# Patient Record
Sex: Male | Born: 1965 | Race: Black or African American | Hispanic: No | Marital: Single | Smoking: Current every day smoker
Health system: Southern US, Community
[De-identification: ages and names within clinical notes are randomized; demographics above are authoritative.]

## PROBLEM LIST (undated history)

## (undated) DIAGNOSIS — D649 Anemia, unspecified: Secondary | ICD-10-CM

## (undated) HISTORY — DX: Anemia, unspecified: D64.9

---

## 2007-01-21 ENCOUNTER — Emergency Department (HOSPITAL_COMMUNITY): Admission: EM | Admit: 2007-01-21 | Discharge: 2007-01-21 | Payer: Self-pay | Admitting: Emergency Medicine

## 2008-09-20 ENCOUNTER — Emergency Department (HOSPITAL_COMMUNITY): Admission: EM | Admit: 2008-09-20 | Discharge: 2008-09-20 | Payer: Self-pay | Admitting: Emergency Medicine

## 2012-12-29 ENCOUNTER — Emergency Department (HOSPITAL_COMMUNITY)
Admission: EM | Admit: 2012-12-29 | Discharge: 2012-12-29 | Disposition: A | Discharge status: Home / Self Care | Payer: Self-pay | Attending: Emergency Medicine | Admitting: Emergency Medicine

## 2012-12-29 ENCOUNTER — Encounter (HOSPITAL_COMMUNITY): Payer: Self-pay

## 2012-12-29 DIAGNOSIS — Y9289 Other specified places as the place of occurrence of the external cause: Secondary | ICD-10-CM | POA: Insufficient documentation

## 2012-12-29 DIAGNOSIS — X58XXXA Exposure to other specified factors, initial encounter: Secondary | ICD-10-CM | POA: Insufficient documentation

## 2012-12-29 DIAGNOSIS — H53149 Visual discomfort, unspecified: Secondary | ICD-10-CM | POA: Insufficient documentation

## 2012-12-29 DIAGNOSIS — F17200 Nicotine dependence, unspecified, uncomplicated: Secondary | ICD-10-CM | POA: Insufficient documentation

## 2012-12-29 DIAGNOSIS — Y9389 Activity, other specified: Secondary | ICD-10-CM | POA: Insufficient documentation

## 2012-12-29 DIAGNOSIS — S058X9A Other injuries of unspecified eye and orbit, initial encounter: Secondary | ICD-10-CM | POA: Insufficient documentation

## 2012-12-29 DIAGNOSIS — S0502XA Injury of conjunctiva and corneal abrasion without foreign body, left eye, initial encounter: Secondary | ICD-10-CM

## 2012-12-29 MED ORDER — TETRACAINE HCL 0.5 % OP SOLN
1.0000 [drp] | Freq: Once | OPHTHALMIC | Status: AC
  Administered 2012-12-29: 1 [drp] via OPHTHALMIC
  Filled 2012-12-29: qty 2

## 2012-12-29 MED ORDER — HYDROCODONE-ACETAMINOPHEN 5-325 MG PO TABS: 1.0000 | ORAL_TABLET | ORAL | Status: DC | PRN

## 2012-12-29 MED ORDER — ERYTHROMYCIN 5 MG/GM OP OINT: TOPICAL_OINTMENT | OPHTHALMIC | Status: DC

## 2012-12-29 NOTE — ED Provider Notes (Signed)
CSN: [id number]     Arrival date & time 12/29/12  1922 History   This chart was scribed for non-physician practitioner,Katie Reda Citron, PA-C ,working with No att. providers found, by Karle Plumber, ED Scribe.  This patient was seen in room WTR5/WTR5 and the patient's care was started at 8:10 PM.   Chief Complaint  Patient presents with  . Eye Injury   The history is provided by the patient. No language interpreter was used.   HPI Comments:  Evan Brown is a 47 y.o. male who presents to the Emergency Department complaining of constant, moderate left eye pain since 3 PM yesterday when he was weeding. He reports foreign body sensation. He reports he has associated photophobia itching and tearing of the eye. He denies any eye discharge or vision changes.  Does not wear corrective lenses. He denies any other pertinent medical issues. Pt states his last tetanus was approximately 4 years ago.    History reviewed. No pertinent past medical history. History reviewed. No pertinent past surgical history. History reviewed. No pertinent family history. History  Substance Use Topics  . Smoking status: Current Every Day Smoker -- 0.50 packs/day    Types: Cigarettes  . Smokeless tobacco: Not on file  . Alcohol Use: Yes     Comment: social    Review of Systems  Eyes: Positive for photophobia and pain.  All other systems reviewed and are negative.    Allergies  Review of patient's allergies indicates not on file.  Home Medications  No current outpatient prescriptions on file. Triage Vitals: BP 169/97  Pulse 87  Temp(Src) 98.2 F (36.8 C) (Oral)  Resp 18  SpO2 99% Physical Exam  Nursing note and vitals reviewed. Constitutional: He is oriented to person, place, and time. He appears well-developed and well-nourished. No distress.  HENT:  Head: Normocephalic and atraumatic.  Diffuse L conjunctival injection.  Lacrimation.  EOMi w/out pain.  PERRL. Photophobia both direct and consensual.   No tenderness of globe.  Visual acuity 20/70 R and 20/100 L.  Eye stained w/ fluorescein and visualized w/ black lamp and there is an abrasion over the pupil.    Eyes:  Normal appearance  Neck: Normal range of motion.  Pulmonary/Chest: Effort normal.  Musculoskeletal: Normal range of motion.  Neurological: He is alert and oriented to person, place, and time.  Psychiatric: He has a normal mood and affect. His behavior is normal.    ED Course  Procedures (including critical care time) DIAGNOSTIC STUDIES: Oxygen Saturation is 99% on RA, normal by my interpretation.   COORDINATION OF CARE: 8:30 PM- Will administer tetracaine to relieve pain and then check for corneal abrasion. Will prescribe antibiotics and pain medications. Advised pt to get some OTC artificial tears eye drops. Will give pt a referral to an optometrist and advised him to return to the ED with worsening symptoms. Pt verbalizes understanding and agrees to plan.  Medications  tetracaine (PONTOCAINE) 0.5 % ophthalmic solution 1 drop (1 drop Left Eye Given 12/29/12 2024)    Labs Review Labs Reviewed - No data to display Imaging Review No results found.  MDM   1. Corneal abrasion, left, initial encounter   Pt presents w/ L eye irritation.  No vision changes.  Exam sig for corneal abrasion.  Pt prescribed erythromycin ointment and vicodin. Recommended artifical tears as well.  Referred to ophtho for persistent sx.  Return precautions discussed.   I personally performed the services described in this documentation, which was scribed  in my presence. The recorded information has been reviewed and is accurate.    Arie Sabina Merced Hanners, PA-C 12/29/12 [phone]

## 2012-12-29 NOTE — Discharge Instructions (Signed)
Take antibiotic as prescribed. Take vicodin as prescribed for severe pain.  Do not drive within four hours of taking this medication (may cause drowsiness or confusion).   You should also try artificial tears to flush eyes and keep them lubricated.  Folllow up with the eye doctor if your symptoms have not started to improve by Wednesday or you have a change in your vision.    Corneal Abrasion The cornea is the clear covering at the front and center of the eye. When looking at the colored portion (iris) of the eye, you are looking through that person's cornea.  This very thin tissue is made up of many layers. The surface layer is a single layer of cells called the corneal epithelium. This is one of the most sensitive tissues in the body. If a scratch or injury causes the corneal epithelium to come off, it is called a corneal abrasion. If the injury extends to the tissues below the epithelium, the condition is called a corneal ulcer.  CAUSES   Scratches.  Trauma.  Foreign body in the eye.  Some people have recurrences of abrasions in the area of the original injury even after they heal. This is called recurrent erosion syndrome. Recurrent erosion syndromes generally improve and go away with time. SYMPTOMS   Eye pain.  Difficulty or inability to keep the injured eye open.  The eye becomes very sensitive to light.  Recurrent erosions tend to happen suddenly, first thing in the morning  usually upon awakening and opening the eyes. DIAGNOSIS  Your eye professional can diagnose a corneal abrasion during an eye exam. Dye is usually placed in the eye using a drop or a small paper strip moistened by the patient's tears. When the eye is examined with a special light, the abrasion shows up clearly because of the dye. TREATMENT   Small abrasions may be treated with antibiotic drops or ointment alone.  Usually a pressure patch is specially applied. Pressure patches prevent the eye from blinking,  allowing the corneal epithelium to heal. Because blinking is less, a pressure patch also reduces the amount of pain present in the eye during healing. Most corneal abrasions heal within 2-3 days with no effect on vision. WARNING: Do not drive or operate machinery while your eye is patched. Your ability to judge distances is impaired.  If abrasion becomes infected and spreads to the deeper tissues of the cornea, a corneal ulcer can result. This is serious because it can cause corneal scarring. Corneal scars interfere with light passing through the cornea, and cause a loss of vision in the involved eye.  If your caregiver has given you a follow-up appointment, it is very important to keep that appointment. Not keeping the appointment could result in a severe eye infection or permanent loss of vision. If there is any problem keeping the appointment, you must call back to this facility for assistance. SEEK MEDICAL CARE IF:   You have pain, light sensitivity and a scratchy feeling in one eye (or both).  Your pressure patch keeps loosening up and you can blink your eye under the patch after treatment.  Any kind of discharge develops from the involved eye after treatment or if the lids stick together in the morning.  You have the same symptoms in the morning as you did with the original abrasion days, weeks or months after the abrasion healed. MAKE SURE YOU:   Understand these instructions.  Will watch your condition.  Will get help right  away if you are not doing well or get worse. Document Released: 04/01/2000 Document Revised: 06/27/2011 Document Reviewed: 11/08/2007 [facility] Patient Information 2014 [place], [place].

## 2012-12-29 NOTE — ED Notes (Signed)
Pt was doing lawn work yesterday.  Had something fly into eye.  Pt states eye continues to be painful and draining.  Did was out with eye was at home with no relief.

## 2012-12-31 NOTE — ED Provider Notes (Signed)
Medical screening examination/treatment/procedure(s) were performed by non-physician practitioner and as supervising physician I was immediately available for consultation/collaboration.   Suzi Roots, MD 12/31/12 [phone]

## 2016-01-09 ENCOUNTER — Emergency Department (HOSPITAL_COMMUNITY): Payer: Self-pay

## 2016-01-09 ENCOUNTER — Encounter (HOSPITAL_COMMUNITY): Payer: Self-pay | Admitting: Emergency Medicine

## 2016-01-09 ENCOUNTER — Emergency Department (HOSPITAL_COMMUNITY)
Admission: EM | Admit: 2016-01-09 | Discharge: 2016-01-09 | Disposition: A | Discharge status: Home / Self Care | Payer: Self-pay | Attending: Emergency Medicine | Admitting: Emergency Medicine

## 2016-01-09 DIAGNOSIS — K8020 Calculus of gallbladder without cholecystitis without obstruction: Secondary | ICD-10-CM | POA: Insufficient documentation

## 2016-01-09 DIAGNOSIS — F17210 Nicotine dependence, cigarettes, uncomplicated: Secondary | ICD-10-CM | POA: Insufficient documentation

## 2016-01-09 DIAGNOSIS — K2970 Gastritis, unspecified, without bleeding: Secondary | ICD-10-CM | POA: Insufficient documentation

## 2016-01-09 DIAGNOSIS — R112 Nausea with vomiting, unspecified: Secondary | ICD-10-CM

## 2016-01-09 DIAGNOSIS — Z791 Long term (current) use of non-steroidal anti-inflammatories (NSAID): Secondary | ICD-10-CM | POA: Insufficient documentation

## 2016-01-09 LAB — CBC
HCT: 31.6 % — ABNORMAL LOW (ref 39.0–52.0)
HEMOGLOBIN: 10.3 g/dL — AB (ref 13.0–17.0)
MCH: 30.5 pg (ref 26.0–34.0)
MCHC: 32.6 g/dL (ref 30.0–36.0)
MCV: 93.5 fL (ref 78.0–100.0)
PLATELETS: 509 10*3/uL — AB (ref 150–400)
RBC: 3.38 MIL/uL — ABNORMAL LOW (ref 4.22–5.81)
RDW: 15.5 % (ref 11.5–15.5)
WBC: 17.4 10*3/uL — ABNORMAL HIGH (ref 4.0–10.5)

## 2016-01-09 LAB — COMPREHENSIVE METABOLIC PANEL
ALBUMIN: 4.1 g/dL (ref 3.5–5.0)
ALK PHOS: 65 U/L (ref 38–126)
ALT: 10 U/L — AB (ref 17–63)
ANION GAP: 7 (ref 5–15)
AST: 14 U/L — ABNORMAL LOW (ref 15–41)
BILIRUBIN TOTAL: 0.5 mg/dL (ref 0.3–1.2)
BUN: 13 mg/dL (ref 6–20)
CALCIUM: 9.2 mg/dL (ref 8.9–10.3)
CO2: 26 mmol/L (ref 22–32)
CREATININE: 0.99 mg/dL (ref 0.61–1.24)
Chloride: 106 mmol/L (ref 101–111)
GFR calc non Af Amer: >60 mL/min (ref >60)
GLUCOSE: 99 mg/dL (ref 65–99)
Potassium: 3.7 mmol/L (ref 3.5–5.1)
Sodium: 139 mmol/L (ref 135–145)
TOTAL PROTEIN: 7.1 g/dL (ref 6.5–8.1)

## 2016-01-09 LAB — URINALYSIS, ROUTINE W REFLEX MICROSCOPIC
BILIRUBIN URINE: NEGATIVE
Glucose, UA: NEGATIVE mg/dL
HGB URINE DIPSTICK: NEGATIVE
KETONES UR: NEGATIVE mg/dL
Leukocytes, UA: NEGATIVE
NITRITE: NEGATIVE
PROTEIN: NEGATIVE mg/dL
SPECIFIC GRAVITY, URINE: 1.011 (ref 1.005–1.030)
pH: 6 (ref 5.0–8.0)

## 2016-01-09 LAB — LIPASE, BLOOD: Lipase: 31 U/L (ref 11–51)

## 2016-01-09 MED ORDER — HYDROMORPHONE HCL 1 MG/ML IJ SOLN
1.0000 mg | Freq: Once | INTRAMUSCULAR | Status: AC
  Administered 2016-01-09: 1 mg via INTRAVENOUS
  Filled 2016-01-09: qty 1

## 2016-01-09 MED ORDER — OMEPRAZOLE 20 MG PO CPDR: 20.0000 mg | DELAYED_RELEASE_CAPSULE | Freq: Every day | ORAL | 2 refills | Status: DC

## 2016-01-09 MED ORDER — MORPHINE SULFATE (PF) 4 MG/ML IV SOLN
4.0000 mg | Freq: Once | INTRAVENOUS | Status: AC
  Administered 2016-01-09: 4 mg via INTRAVENOUS
  Filled 2016-01-09: qty 1

## 2016-01-09 MED ORDER — ONDANSETRON 4 MG PO TBDP: 4.0000 mg | ORAL_TABLET | Freq: Three times a day (TID) | ORAL | 0 refills | Status: DC | PRN

## 2016-01-09 MED ORDER — IOPAMIDOL (ISOVUE-300) INJECTION 61%
100.0000 mL | Freq: Once | INTRAVENOUS | Status: AC | PRN
  Administered 2016-01-09: 100 mL via INTRAVENOUS

## 2016-01-09 MED ORDER — ONDANSETRON HCL 4 MG/2ML IJ SOLN
4.0000 mg | Freq: Once | INTRAMUSCULAR | Status: AC
  Administered 2016-01-09: 4 mg via INTRAVENOUS
  Filled 2016-01-09: qty 2

## 2016-01-09 MED ORDER — SODIUM CHLORIDE 0.9 % IV BOLUS (SEPSIS)
1000.0000 mL | Freq: Once | INTRAVENOUS | Status: AC
  Administered 2016-01-09: 1000 mL via INTRAVENOUS

## 2016-01-09 NOTE — Discharge Instructions (Signed)
Take the prescribed medication as directed. Recommend to avoid spicy/greasy/fatty foods as this will likely make you stomach pain worse. Follow-up with GI-- call to make appt. Return to the ED for new or worsening symptoms.

## 2016-01-09 NOTE — ED Provider Notes (Signed)
[facility] DEPT Provider Note   CSN: [id number] Arrival date & time: 01/09/16  1008     History   Chief Complaint Chief Complaint  Patient presents with  . Abdominal Pain    HPI Evan Brown is a 50 y.o. male.  The history is provided by the patient.  Abdominal Pain   Associated symptoms include vomiting.    50 year old male with no significant past medical history presenting to the ED for abdominal pain. He reports this is been going on for the past 3 weeks or more. He reports pain in his epigastric region. No noted alleviating or exacerbating factors. States he has been eating and drinking normally. Intermittent episodes of vomiting. No diarrhea, bowel movements have been normal. No fever or chills. Prior abdominal surgeries. States she took some Aleve last night and this morning without significant relief. Denies any chest pain or shortness of breath.  History reviewed. No pertinent past medical history.  There are no active problems to display for this patient.   History reviewed. No pertinent surgical history.     Home Medications    Prior to Admission medications   Medication Sig Start Date End Date Taking? Authorizing Provider  erythromycin ophthalmic ointment Place a 1/2 inch ribbon of ointment  into the lower eyelid every four hours 12/29/12   Gertha Calkin, PA-C  HYDROcodone-acetaminophen (NORCO/VICODIN) 5-325 MG per tablet Take 1 tablet by mouth every 4 (four) hours as needed for pain. 12/29/12   Gertha Calkin, PA-C    Family History No family history on file.  Social History Social History  Substance Use Topics  . Smoking status: Current Every Day Smoker    Packs/day: 0.50    Types: Cigarettes  . Smokeless tobacco: Not on file  . Alcohol use Yes     Comment: social     Allergies   Review of patient's allergies indicates not on file.   Review of Systems Review of Systems  Gastrointestinal: Positive for abdominal pain and  vomiting.  All other systems reviewed and are negative.    Physical Exam Updated Vital Signs BP 151/86 (BP Location: Left Arm)   Pulse 78   Temp 97.6 F (36.4 C) (Oral)   Resp 16   SpO2 100%   Physical Exam  Constitutional: He is oriented to person, place, and time. He appears well-developed and well-nourished.  HENT:  Head: Normocephalic and atraumatic.  Mouth/Throat: Oropharynx is clear and moist.  Eyes: Conjunctivae and EOM are normal. Pupils are equal, round, and reactive to light.  Neck: Normal range of motion.  Cardiovascular: Normal rate, regular rhythm and normal heart sounds.   Pulmonary/Chest: Effort normal and breath sounds normal.  Abdominal: Soft. Bowel sounds are normal. There is tenderness.  Abdomen soft, nondistended, tenderness in the epigastrium without rebound or guarding  Musculoskeletal: Normal range of motion.  Neurological: He is alert and oriented to person, place, and time.  Skin: Skin is warm and dry.  Psychiatric: He has a normal mood and affect.  Nursing note and vitals reviewed.    ED Treatments / Results  Labs (all labs ordered are listed, but only abnormal results are displayed) Labs Reviewed  COMPREHENSIVE METABOLIC PANEL - Abnormal; Notable for the following:       Result Value   AST 14 (*)    ALT 10 (*)    All other components within normal limits  CBC - Abnormal; Notable for the following:    WBC 17.4 (*)    RBC 3.38 (*)  Hemoglobin 10.3 (*)    HCT 31.6 (*)    Platelets 509 (*)    All other components within normal limits  LIPASE, BLOOD  URINALYSIS, ROUTINE W REFLEX MICROSCOPIC (NOT AT [facility])    EKG  EKG Interpretation None       Radiology US Abdomen Complete  Result Date: 01/09/2016 CLINICAL DATA:  Epigastric pain. EXAM: ABDOMEN ULTRASOUND COMPLETE COMPARISON:  None. FINDINGS: Gallbladder: There are dependent gallstones, largest measuring 1.6 cm. There is some associated sludge. There is no wall thickening or  pericholecystic fluid. Common bile duct: Diameter: 4.5 mm Liver: No focal lesion identified. Within normal limits in parenchymal echogenicity. IVC: No abnormality visualized. Pancreas: Visualized portion unremarkable. Spleen: Size and appearance within normal limits. Right Kidney: Length: 12.3 cm. Echogenicity within normal limits. No mass or hydronephrosis visualized. Left Kidney: Length: 11.8 cm. Echogenicity within normal limits. No mass or hydronephrosis visualized. Abdominal aorta: No aneurysm visualized. Other findings: None. IMPRESSION: 1. Cholelithiasis without evidence of acute cholecystitis. No bile duct dilation. 2. No other abnormalities. Electronically Signed   By: Lajean Manes M.D.   On: 01/09/2016 11:35   Ct Abdomen Pelvis W Contrast  Result Date: 01/09/2016 CLINICAL DATA:  Diffuse abdominal pain for the past 3 weeks. The pain is worsening. Vomiting. EXAM: CT ABDOMEN AND PELVIS WITH CONTRAST TECHNIQUE: Multidetector CT imaging of the abdomen and pelvis was performed using the standard protocol following bolus administration of intravenous contrast. CONTRAST:  124mL ISOVUE-300 IOPAMIDOL (ISOVUE-300) INJECTION 61% COMPARISON:  Abdomen ultrasound obtained today. FINDINGS: Lower chest: No acute abnormality. Hepatobiliary: Normal appearing liver. 7 mm linear gallstone in the gallbladder without wall thickening or pericholecystic fluid. The previously demonstrated larger gallstone is not visible by CT. Pancreas: Unremarkable. No pancreatic ductal dilatation or surrounding inflammatory changes. Spleen: Normal in size without focal abnormality. Adrenals/Urinary Tract: 2.2 x 1.6 cm left adrenal mass. Normal appearing right adrenal gland. Tiny upper pole right renal cyst and possible tiny lower pole right renal calculus. Normal appearing left kidney, ureters and urinary bladder. Stomach/Bowel: Mild to moderate medium to low density wall thickening involving the mid and distal portions of the stomach, most  pronounced in the region of the antrum. Appendix appears normal. No evidence of bowel wall thickening, distention, or inflammatory changes. Vascular/Lymphatic: Tortuous aorta. Mild iliac and distal abdominal aorta calcifications. No aneurysm. No enlarged lymph nodes. Reproductive: Normal sized prostate gland. Other: No abdominal wall hernia or abnormality. No abdominopelvic ascites. Musculoskeletal: Minimal bilateral hip degenerative changes. Mild lumbar and lower thoracic spine degenerative changes. IMPRESSION: 1. Mild-to-moderate medium to low density wall thickening involving the mid and distal portions of the stomach. This may be due to gastritis. 2. Possible tiny, nonobstructing lower pole right renal calculus. 3. Cholelithiasis. Electronically Signed   By: Claudie Revering M.D.   On: 01/09/2016 14:46    Procedures Procedures (including critical care time)  Medications Ordered in ED Medications - No data to display   Initial Impression / Assessment and Plan / ED Course  I have reviewed the triage vital signs and the nursing notes.  Pertinent labs & imaging results that were available during my care of the patient were reviewed by me and considered in my medical decision making (see chart for details).  Clinical Course   50 year old male here with ongoing epigastric pain for the past 3 weeks or so.  He reports associated nausea or vomiting. He is afebrile and nontoxic. Tenderness in the epigastrium without rebound or guarding. Labs pending. Will obtain ultrasound. Morphine  and Zofran given.  12:52 PM Patient reevaluated, no change in pain after morphine. Labs with significant leukocytosis at 17 K. LFTs normal. Lipase normal. Ultrasound with gallstones, no signs of cholecystitis.  Only small portion of pancreas visualized on ultrasound.  Patient continue with only epigastric pain. Will obtain CT scan for further evaluation  2:56 PM CT scan with findings of gastritis.  No hx of gastic ulcers.   Has never seen GI in the past.  Pain improved here.  No active emesis in the ED.  Will start on PPI, refer to GI for follow-up.  Recommended dietary modifications including limiting fatty/greasy/spicy foods to help better control gastritis symptoms. Discussed plan with patient, he acknowledged understanding and agreed with plan of care.  Return precautions given for new or worsening symptoms.  Final Clinical Impressions(s) / ED Diagnoses   Final diagnoses:  Gastritis  Gallstones  Nausea and vomiting, vomiting of unspecified type    New Prescriptions New Prescriptions   OMEPRAZOLE (PRILOSEC) 20 MG CAPSULE    Take 1 capsule (20 mg total) by mouth daily.   ONDANSETRON (ZOFRAN ODT) 4 MG DISINTEGRATING TABLET    Take 1 tablet (4 mg total) by mouth every 8 (eight) hours as needed for nausea.     Larene Pickett, PA-C 01/09/16 1501    Daleen Bo, MD 01/09/16 1630

## 2016-01-09 NOTE — ED Notes (Signed)
Patient transported to CT 

## 2016-01-09 NOTE — ED Notes (Signed)
Ultrasound at bedside

## 2016-01-09 NOTE — ED Triage Notes (Signed)
Pt c/o generalized abdominal pain x 3 weeks. Pt denies any exacerbating circumstances, sts "It's just getting progressively worse. Pt A&Ox4 and ambulatory. C/o emesis. Denies diarrhea. Denies chest pain or SOB.

## 2016-01-09 NOTE — ED Notes (Signed)
Patient returned from CT

## 2018-11-05 ENCOUNTER — Encounter (HOSPITAL_COMMUNITY): Payer: Self-pay

## 2018-11-05 ENCOUNTER — Emergency Department (HOSPITAL_COMMUNITY)
Admission: EM | Admit: 2018-11-05 | Discharge: 2018-11-05 | Disposition: A | Discharge status: Home / Self Care | Payer: Medicaid Other | Attending: Emergency Medicine | Admitting: Emergency Medicine

## 2018-11-05 ENCOUNTER — Emergency Department (HOSPITAL_COMMUNITY): Payer: Medicaid Other

## 2018-11-05 ENCOUNTER — Other Ambulatory Visit: Payer: Self-pay

## 2018-11-05 DIAGNOSIS — F17210 Nicotine dependence, cigarettes, uncomplicated: Secondary | ICD-10-CM | POA: Diagnosis not present

## 2018-11-05 DIAGNOSIS — R1013 Epigastric pain: Secondary | ICD-10-CM | POA: Insufficient documentation

## 2018-11-05 LAB — URINALYSIS, ROUTINE W REFLEX MICROSCOPIC
Bacteria, UA: NONE SEEN
Bilirubin Urine: NEGATIVE
Glucose, UA: NEGATIVE mg/dL
Hgb urine dipstick: NEGATIVE
Ketones, ur: 5 mg/dL — AB
Leukocytes,Ua: NEGATIVE
Nitrite: NEGATIVE
Protein, ur: 30 mg/dL — AB
Specific Gravity, Urine: 1.023 (ref 1.005–1.030)
pH: 5 (ref 5.0–8.0)

## 2018-11-05 LAB — CBC
HCT: 52.7 % — ABNORMAL HIGH (ref 39.0–52.0)
Hemoglobin: 16.8 g/dL (ref 13.0–17.0)
MCH: 29.7 pg (ref 26.0–34.0)
MCHC: 31.9 g/dL (ref 30.0–36.0)
MCV: 93.1 fL (ref 80.0–100.0)
Platelets: 434 10*3/uL — ABNORMAL HIGH (ref 150–400)
RBC: 5.66 MIL/uL (ref 4.22–5.81)
RDW: 13.3 % (ref 11.5–15.5)
WBC: 14.8 10*3/uL — ABNORMAL HIGH (ref 4.0–10.5)
nRBC: 0 % (ref 0.0–0.2)

## 2018-11-05 LAB — COMPREHENSIVE METABOLIC PANEL
ALT: 17 U/L (ref 0–44)
AST: 14 U/L — ABNORMAL LOW (ref 15–41)
Albumin: 4.5 g/dL (ref 3.5–5.0)
Alkaline Phosphatase: 89 U/L (ref 38–126)
Anion gap: 9 (ref 5–15)
BUN: 17 mg/dL (ref 6–20)
CO2: 28 mmol/L (ref 22–32)
Calcium: 10.4 mg/dL — ABNORMAL HIGH (ref 8.9–10.3)
Chloride: 104 mmol/L (ref 98–111)
Creatinine, Ser: 1.23 mg/dL (ref 0.61–1.24)
GFR calc Af Amer: >60 mL/min (ref >60)
GFR calc non Af Amer: >60 mL/min (ref >60)
Glucose, Bld: 137 mg/dL — ABNORMAL HIGH (ref 70–99)
Potassium: 3.8 mmol/L (ref 3.5–5.1)
Sodium: 141 mmol/L (ref 135–145)
Total Bilirubin: 0.7 mg/dL (ref 0.3–1.2)
Total Protein: 7.9 g/dL (ref 6.5–8.1)

## 2018-11-05 LAB — LIPASE, BLOOD: Lipase: 28 U/L (ref 11–51)

## 2018-11-05 MED ORDER — LIDOCAINE VISCOUS HCL 2 % MT SOLN
15.0000 mL | Freq: Once | OROMUCOSAL | Status: AC
  Administered 2018-11-05: 15 mL via ORAL
  Filled 2018-11-05: qty 15

## 2018-11-05 MED ORDER — ONDANSETRON HCL 4 MG/2ML IJ SOLN
4.0000 mg | Freq: Once | INTRAMUSCULAR | Status: AC
  Administered 2018-11-05: 16:00:00 4 mg via INTRAVENOUS
  Filled 2018-11-05: qty 2

## 2018-11-05 MED ORDER — IOHEXOL 300 MG/ML  SOLN
100.0000 mL | Freq: Once | INTRAMUSCULAR | Status: AC | PRN
  Administered 2018-11-05: 100 mL via INTRAVENOUS

## 2018-11-05 MED ORDER — ONDANSETRON 4 MG PO TBDP
4.0000 mg | ORAL_TABLET | Freq: Once | ORAL | Status: AC | PRN
  Administered 2018-11-05: 4 mg via ORAL
  Filled 2018-11-05: qty 1

## 2018-11-05 MED ORDER — ALUM & MAG HYDROXIDE-SIMETH 200-200-20 MG/5ML PO SUSP
30.0000 mL | Freq: Once | ORAL | Status: AC
  Administered 2018-11-05: 30 mL via ORAL
  Filled 2018-11-05: qty 30

## 2018-11-05 MED ORDER — SUCRALFATE 1 GM/10ML PO SUSP: 1.0000 g | Freq: Three times a day (TID) | ORAL | 0 refills | Status: DC

## 2018-11-05 MED ORDER — SODIUM CHLORIDE 0.9% FLUSH
3.0000 mL | Freq: Once | INTRAVENOUS | Status: AC
  Administered 2018-11-05: 3 mL via INTRAVENOUS

## 2018-11-05 MED ORDER — ONDANSETRON 4 MG PO TBDP: 4.0000 mg | ORAL_TABLET | Freq: Three times a day (TID) | ORAL | 0 refills | Status: DC | PRN

## 2018-11-05 MED ORDER — FAMOTIDINE 20 MG PO TABS: 20.0000 mg | ORAL_TABLET | Freq: Two times a day (BID) | ORAL | 0 refills | Status: DC

## 2018-11-05 MED ORDER — SODIUM CHLORIDE 0.9 % IV BOLUS
1000.0000 mL | Freq: Once | INTRAVENOUS | Status: AC
  Administered 2018-11-05: 1000 mL via INTRAVENOUS

## 2018-11-05 MED ORDER — SODIUM CHLORIDE (PF) 0.9 % IJ SOLN
INTRAMUSCULAR | Status: AC
  Filled 2018-11-05: qty 50

## 2018-11-05 MED ORDER — FAMOTIDINE IN NACL 20-0.9 MG/50ML-% IV SOLN
20.0000 mg | Freq: Once | INTRAVENOUS | Status: AC
  Administered 2018-11-05: 20 mg via INTRAVENOUS
  Filled 2018-11-05: qty 50

## 2018-11-05 MED ORDER — MORPHINE SULFATE (PF) 4 MG/ML IV SOLN
4.0000 mg | Freq: Once | INTRAVENOUS | Status: AC
  Administered 2018-11-05: 4 mg via INTRAVENOUS
  Filled 2018-11-05: qty 1

## 2018-11-05 MED ORDER — OMEPRAZOLE 40 MG PO CPDR: 40.0000 mg | DELAYED_RELEASE_CAPSULE | Freq: Every day | ORAL | 0 refills | Status: DC

## 2018-11-05 NOTE — ED Provider Notes (Signed)
[facility] DEPT Provider Note   CSN: [id number] Arrival date & time: 11/05/18  1037     History   Chief Complaint Chief Complaint  Patient presents with  . Abdominal Pain    HPI Evan Brown is a 53 y.o. male with a hx of HTN- no longer on anti-hypertensive medication who presents to the ED with complaints of abdominal pain x 3 days. Patient reports pain is located in the periumbilical/epgastirc area, it is sharp/burning in nature currenlty a 7/10 in severity, initially tums were helping but are not anymore, currently w/o alleviating/aggravating factors. Reports associated nausea with emesis that began last evening- has had 3-4 episodes of non bloody vomiting. Denies fever, chills, diarrhea, constipation, melena, hematochezia, hematemesis, dysuria, testicular pain/swelling, chest pain, or dyspnea. Feel similar to prior gastritis, has been taking omeprazole as prescribed w/p relief.      HPI  History reviewed. No pertinent past medical history.  There are no active problems to display for this patient.   History reviewed. No pertinent surgical history.      Home Medications    Prior to Admission medications   Medication Sig Start Date End Date Taking? Authorizing Provider  Ca Carbonate-Mag Hydroxide (ROLAIDS) 550-110 MG CHEW Chew 1-2 tablets by mouth daily as needed (heartburtn).    [provider]  Naproxen Sod-Diphenhydramine (ALEVE PM PO) Take 2 tablets by mouth at bedtime as needed (pain,sleep).    [provider]  omeprazole (PRILOSEC) 20 MG capsule Take 1 capsule (20 mg total) by mouth daily. 01/09/16   Larene Pickett, PA-C  ondansetron (ZOFRAN ODT) 4 MG disintegrating tablet Take 1 tablet (4 mg total) by mouth every 8 (eight) hours as needed for nausea. 01/09/16   Larene Pickett, PA-C    Family History No family history on file.  Social History Social History   Tobacco Use  . Smoking status: Current Every Day  Smoker    Packs/day: 0.50    Types: Cigarettes  . Smokeless tobacco: Never Used  Substance Use Topics  . Alcohol use: Not Currently    Frequency: Never  . Drug use: Never     Allergies   Patient has no known allergies.   Review of Systems Review of Systems  Constitutional: Negative for chills and fever.  Respiratory: Negative for shortness of breath.   Cardiovascular: Negative for chest pain.  Gastrointestinal: Positive for abdominal pain, nausea and vomiting. Negative for blood in stool, constipation and diarrhea.  Genitourinary: Negative for discharge, dysuria, flank pain, scrotal swelling and testicular pain.  Neurological: Negative for weakness and numbness.  All other systems reviewed and are negative.    Physical Exam Updated Vital Signs BP (!) 187/117   Pulse 89   Temp 97.8 F (36.6 C) (Oral)   Resp 18   Wt 95.3 kg   SpO2 95%   Physical Exam Vitals signs and nursing note reviewed.  Constitutional:      General: He is not in acute distress.    Appearance: He is well-developed. He is not toxic-appearing.  HENT:     Head: Normocephalic and atraumatic.  Eyes:     General:        Right eye: No discharge.        Left eye: No discharge.     Conjunctiva/sclera: Conjunctivae normal.  Neck:     Musculoskeletal: Neck supple.  Cardiovascular:     Rate and Rhythm: Normal rate and regular rhythm.     Pulses:  Dorsalis pedis pulses are 2+ on the right side and 2+ on the left side.       Posterior tibial pulses are 2+ on the right side and 2+ on the left side.  Pulmonary:     Effort: Pulmonary effort is normal. No respiratory distress.     Breath sounds: Normal breath sounds. No wheezing, rhonchi or rales.  Abdominal:     General: Bowel sounds are normal. There is no distension or abdominal bruit.     Palpations: Abdomen is soft.     Tenderness: There is abdominal tenderness in the epigastric area and periumbilical area. There is no guarding or rebound.  Negative signs include Murphy's sign and McBurney's sign.  Skin:    General: Skin is warm and dry.     Findings: No rash.  Neurological:     General: No focal deficit present.     Mental Status: He is alert.     Comments: Clear speech.   Psychiatric:        Behavior: Behavior normal.    ED Treatments / Results  Labs (all labs ordered are listed, but only abnormal results are displayed) Labs Reviewed  COMPREHENSIVE METABOLIC PANEL - Abnormal; Notable for the following components:      Result Value   Glucose, Bld 137 (*)    Calcium 10.4 (*)    AST 14 (*)    All other components within normal limits  CBC - Abnormal; Notable for the following components:   WBC 14.8 (*)    HCT 52.7 (*)    Platelets 434 (*)    All other components within normal limits  URINALYSIS, ROUTINE W REFLEX MICROSCOPIC - Abnormal; Notable for the following components:   Ketones, ur 5 (*)    Protein, ur 30 (*)    All other components within normal limits  LIPASE, BLOOD    EKG None  Radiology Ct Abdomen Pelvis W Contrast  Result Date: 11/05/2018 CLINICAL DATA:  Abdominal pain for 2 days, generalized abdominal pain, no relief with omeprazole, smoker EXAM: CT ABDOMEN AND PELVIS WITH CONTRAST TECHNIQUE: Multidetector CT imaging of the abdomen and pelvis was performed using the standard protocol following bolus administration of intravenous contrast. Sagittal and coronal MPR images reconstructed from axial data set. CONTRAST:  154mL OMNIPAQUE IOHEXOL 300 MG/ML SOLN IV. No oral contrast. COMPARISON:  01/09/2016 FINDINGS: Lower chest: Mild dependent bibasilar atelectasis Hepatobiliary: Single small calculus within gallbladder. Gallbladder and liver otherwise normal appearance. No biliary dilatation. Pancreas: Normal appearance Spleen: Normal appearance Adrenals/Urinary Tract: LEFT adrenal mass 2.7 x 1.9 cm image 29, demonstrating significant washout of contrast on delayed images consistent with adrenal adenoma. RIGHT  adrenal gland unremarkable. Kidneys, ureters, and poorly distended bladder otherwise normal appearance. Stomach/Bowel: Stomach incompletely distended, limiting assessment. Gastric wall appears thickened and irregular on the previous exam, showing only a questionable focus of wall thickening at the lesser curve. Remainder stomach distends normally. Stomach and bowel loops normal appearance. Appendix normal. Vascular/Lymphatic: Atherosclerotic calcifications of aorta and iliac arteries, which are tortuous but normal in caliber. No adenopathy. Reproductive: Unremarkable seminal vesicles and prostate gland Other: No free air or free fluid. No hernia or inflammatory process. Musculoskeletal: No acute osseous findings. IMPRESSION: Slight increase in size of a LEFT adrenal mass with washout characteristics consistent with an adrenal adenoma. Significantly improved gastric wall thickening since the previous study with only a single segment of questionable wall thickening at the lesser curve of the mid stomach, cannot exclude gastritis or mass; follow-up  upper endoscopy or upper GI assessment recommended. Cholelithiasis. Electronically Signed   By: Lavonia Dana M.D.   On: 11/05/2018 17:15    Procedures Procedures (including critical care time)  Medications Ordered in ED Medications  sodium chloride flush (NS) 0.9 % injection 3 mL (has no administration in time range)  sodium chloride 0.9 % bolus 1,000 mL (has no administration in time range)  ondansetron (ZOFRAN) injection 4 mg (has no administration in time range)  morphine 4 MG/ML injection 4 mg (has no administration in time range)  ondansetron (ZOFRAN-ODT) disintegrating tablet 4 mg (4 mg Oral Given 11/05/18 1050)     Initial Impression / Assessment and Plan / ED Course  I have reviewed the triage vital signs and the nursing notes.  Pertinent labs & imaging results that were available during my care of the patient were reviewed by me and considered in my  medical decision making (see chart for details).    Patient presents to the ED with complaints of abdominal pain. Patient nontoxic appearing, in no apparent distress, vitals WNL other than elevated BP- low suspicion for HTN emergency On exam patient tender to epigastrium & periumbilical area, no peritoneal signs. Will evaluate with labs and CT abdomen/pelvis. Analgesics, anti-emetics, and fluids administered.   ER work-up reviewed:  CBC: Leukocytosis @ 14.8. Platelets somewhat elevated @ 434. No anemia CMP: Mild hyperglycemia @ 137. No significant electrolyte derangement. Renal function preserved.  Lipase: WNL UA: Mild ketonuria & proteinuria- PCP recheck, no UTI  CT A/P: ernia or inflammatory process. Musculoskeletal: No acute osseous findings. IMPRESSION: Slight increase in size of a LEFT adrenal mass with washout characteristics consistent with an adrenal adenoma. Significantly improved gastric wall thickening since the previous study with only a single segment of questionable wall thickening at the lesser curve of the mid stomach, cannot exclude gastritis or mass; follow-up upper endoscopy or upper GI assessment recommended. Cholelithiasis.  On repeat abdominal exam patient remains without peritoneal signs, doubt cholecystitis, pancreatitis, diverticulitis, appendicitis, or bowel obstruction/perforation. BP improving, symmetric distal pulses, doubt dissection.  Overall sxs seem consistent w/ gastritis to a degree given H&P & CT findings. He is feeling much better, tolerating PO, states he feels ready to go home. Will increase PPI to omeprazole 40 mg daily from his prior 20 mg, start H2 blocker, & carafate, GI follow up. I discussed results, treatment plan, need for follow-up, and return precautions with the patient. Provided opportunity for questions, patient confirmed understanding and is in agreement with plan.   Vitals:   11/05/18 1830 11/05/18 1930  BP: (!) 153/101 123/79  Pulse: 73 72   Resp: 12 20  Temp:    SpO2: 98% 96%    Final Clinical Impressions(s) / ED Diagnoses   Final diagnoses:  Epigastric pain    ED Discharge Orders         Ordered    ondansetron (ZOFRAN ODT) 4 MG disintegrating tablet  Every 8 hours PRN     11/05/18 1936    omeprazole (PRILOSEC) 40 MG capsule  Daily     11/05/18 1936    famotidine (PEPCID) 20 MG tablet  2 times daily     11/05/18 1936    sucralfate (CARAFATE) 1 GM/10ML suspension  3 times daily with meals & bedtime     11/05/18 [address] 11/05/18 Despina Hick, MD 11/05/18 2256

## 2018-11-05 NOTE — Discharge Instructions (Addendum)
You were seen in the ER today for abdominal pain Your labs showed that you had some ketones/protein in your urine- please have this rechecked by primary care within 1-2 weeks.   Your CT scan showed an adrenal adenoma which was present on prior imaging.  CT scan also showed improvement in the appearance of your gastritis on last imaging, there is however questionable area that may be gastritis versus a mass, is extremely important that you follow-up with gastroenterology for further evaluation of this.  Please call [facility] tomorrow to schedule an appointment within the next 3 days.  Send you home with omeprazole (an increased dose from your prior dosing), Pepcid, carafate and zofran. Please take these as prescribed.   We have prescribed you new medication(s) today. Discuss the medications prescribed today with your pharmacist as they can have adverse effects and interactions with your other medicines including over the counter and prescribed medications. Seek medical evaluation if you start to experience new or abnormal symptoms after taking one of these medicines, seek care immediately if you start to experience difficulty breathing, feeling of your throat closing, facial swelling, or rash as these could be indications of a more serious allergic reaction  Please also follow attached diet guidelines.  Your blood pressure was elevated in the emergency department, please call primary care to set up a follow-up appointment for this as well as for your labs, if you do not have primary care please see the information circled in your discharge instructions.  Return to the ER for new or worsening symptoms including but not limited to fever, increased pain, inability to keep fluids down, blood in your stool, blood in vomit, or any other concerns.

## 2018-11-05 NOTE — ED Triage Notes (Signed)
Pt states he has hx of gastritis. Pt states his stomach has been bothering him x 2 days. Pt c/o generalized abd pain.  No relief with omeprazole.

## 2018-12-27 ENCOUNTER — Emergency Department (HOSPITAL_COMMUNITY): Payer: Medicaid Other

## 2018-12-27 ENCOUNTER — Encounter (HOSPITAL_COMMUNITY): Payer: Self-pay | Admitting: Emergency Medicine

## 2018-12-27 ENCOUNTER — Other Ambulatory Visit: Payer: Self-pay

## 2018-12-27 ENCOUNTER — Emergency Department (HOSPITAL_COMMUNITY)
Admission: EM | Admit: 2018-12-27 | Discharge: 2018-12-27 | Disposition: A | Discharge status: Home / Self Care | Payer: Medicaid Other | Attending: Emergency Medicine | Admitting: Emergency Medicine

## 2018-12-27 DIAGNOSIS — K2970 Gastritis, unspecified, without bleeding: Secondary | ICD-10-CM | POA: Diagnosis not present

## 2018-12-27 DIAGNOSIS — R1111 Vomiting without nausea: Secondary | ICD-10-CM

## 2018-12-27 DIAGNOSIS — K859 Acute pancreatitis without necrosis or infection, unspecified: Secondary | ICD-10-CM

## 2018-12-27 DIAGNOSIS — F17210 Nicotine dependence, cigarettes, uncomplicated: Secondary | ICD-10-CM | POA: Insufficient documentation

## 2018-12-27 DIAGNOSIS — R112 Nausea with vomiting, unspecified: Secondary | ICD-10-CM | POA: Diagnosis present

## 2018-12-27 LAB — CBC
HCT: 52.9 % — ABNORMAL HIGH (ref 39.0–52.0)
Hemoglobin: 17.4 g/dL — ABNORMAL HIGH (ref 13.0–17.0)
MCH: 30.3 pg (ref 26.0–34.0)
MCHC: 32.9 g/dL (ref 30.0–36.0)
MCV: 92 fL (ref 80.0–100.0)
Platelets: 493 10*3/uL — ABNORMAL HIGH (ref 150–400)
RBC: 5.75 MIL/uL (ref 4.22–5.81)
RDW: 13.2 % (ref 11.5–15.5)
WBC: 21.1 10*3/uL — ABNORMAL HIGH (ref 4.0–10.5)
nRBC: 0 % (ref 0.0–0.2)

## 2018-12-27 LAB — URINALYSIS, ROUTINE W REFLEX MICROSCOPIC
Bilirubin Urine: NEGATIVE
Glucose, UA: NEGATIVE mg/dL
Hgb urine dipstick: NEGATIVE
Ketones, ur: 5 mg/dL — AB
Leukocytes,Ua: NEGATIVE
Nitrite: NEGATIVE
Protein, ur: NEGATIVE mg/dL
Specific Gravity, Urine: 1.019 (ref 1.005–1.030)
pH: 5 (ref 5.0–8.0)

## 2018-12-27 LAB — LIPASE, BLOOD: Lipase: 56 U/L — ABNORMAL HIGH (ref 11–51)

## 2018-12-27 LAB — COMPREHENSIVE METABOLIC PANEL
ALT: 19 U/L (ref 0–44)
AST: 11 U/L — ABNORMAL LOW (ref 15–41)
Albumin: 4.5 g/dL (ref 3.5–5.0)
Alkaline Phosphatase: 87 U/L (ref 38–126)
Anion gap: 13 (ref 5–15)
BUN: 30 mg/dL — ABNORMAL HIGH (ref 6–20)
CO2: 27 mmol/L (ref 22–32)
Calcium: 9.5 mg/dL (ref 8.9–10.3)
Chloride: 99 mmol/L (ref 98–111)
Creatinine, Ser: 1.47 mg/dL — ABNORMAL HIGH (ref 0.61–1.24)
GFR calc Af Amer: >60 mL/min (ref >60)
GFR calc non Af Amer: 53 mL/min — ABNORMAL LOW (ref >60)
Glucose, Bld: 141 mg/dL — ABNORMAL HIGH (ref 70–99)
Potassium: 4.2 mmol/L (ref 3.5–5.1)
Sodium: 139 mmol/L (ref 135–145)
Total Bilirubin: 1.2 mg/dL (ref 0.3–1.2)
Total Protein: 8.9 g/dL — ABNORMAL HIGH (ref 6.5–8.1)

## 2018-12-27 MED ORDER — PROMETHAZINE HCL 25 MG PO TABS: 25.0000 mg | ORAL_TABLET | Freq: Four times a day (QID) | ORAL | 0 refills | Status: DC | PRN

## 2018-12-27 MED ORDER — SODIUM CHLORIDE 0.9 % IV BOLUS
1000.0000 mL | Freq: Once | INTRAVENOUS | Status: AC
  Administered 2018-12-27: 08:00:00 1000 mL via INTRAVENOUS

## 2018-12-27 MED ORDER — TRAMADOL HCL 50 MG PO TABS: 50.0000 mg | ORAL_TABLET | Freq: Four times a day (QID) | ORAL | 0 refills | Status: DC | PRN

## 2018-12-27 MED ORDER — SODIUM CHLORIDE (PF) 0.9 % IJ SOLN
INTRAMUSCULAR | Status: AC
  Filled 2018-12-27: qty 50

## 2018-12-27 MED ORDER — IOHEXOL 300 MG/ML  SOLN
100.0000 mL | Freq: Once | INTRAMUSCULAR | Status: AC | PRN
  Administered 2018-12-27: 09:00:00 100 mL via INTRAVENOUS

## 2018-12-27 MED ORDER — ONDANSETRON HCL 4 MG/2ML IJ SOLN
4.0000 mg | Freq: Once | INTRAMUSCULAR | Status: AC | PRN
  Administered 2018-12-27: 07:00:00 4 mg via INTRAVENOUS
  Filled 2018-12-27: qty 2

## 2018-12-27 MED ORDER — MORPHINE SULFATE (PF) 4 MG/ML IV SOLN
4.0000 mg | Freq: Once | INTRAVENOUS | Status: AC
  Administered 2018-12-27: 11:00:00 4 mg via INTRAVENOUS
  Filled 2018-12-27: qty 1

## 2018-12-27 MED ORDER — SODIUM CHLORIDE 0.9% FLUSH
3.0000 mL | Freq: Once | INTRAVENOUS | Status: AC
  Administered 2018-12-27: 3 mL via INTRAVENOUS

## 2018-12-27 MED ORDER — OXYCODONE-ACETAMINOPHEN 5-325 MG PO TABS
1.0000 | ORAL_TABLET | Freq: Once | ORAL | Status: AC
  Administered 2018-12-27: 1 via ORAL
  Filled 2018-12-27: qty 1

## 2018-12-27 MED ORDER — ONDANSETRON HCL 4 MG/2ML IJ SOLN
4.0000 mg | Freq: Once | INTRAMUSCULAR | Status: AC
  Administered 2018-12-27: 4 mg via INTRAVENOUS
  Filled 2018-12-27: qty 2

## 2018-12-27 NOTE — ED Provider Notes (Signed)
[facility] DEPT Provider Note   CSN: [id number] Arrival date & time: 12/27/18  [id number]     History   Chief Complaint Chief Complaint  Patient presents with   Abdominal Pain   Emesis    HPI Evan Brown is a 53 y.o. male.     HPI Patient presents to the emergency department with abdominal pain with nausea and vomiting for the last 4 days.  The patient states he is unable to keep any thing on his stomach.  Patient states he did not take any medications prior to arrival for symptoms.  The patient denies chest pain, shortness of breath, headache,blurred vision, neck pain, fever, cough, weakness, numbness, dizziness, anorexia, edema, diarrhea, rash, back pain, dysuria, hematemesis, bloody stool, near syncope, or syncope. History reviewed. No pertinent past medical history.  There are no active problems to display for this patient.   History reviewed. No pertinent surgical history.      Home Medications    Prior to Admission medications   Medication Sig Start Date End Date Taking? Authorizing Provider  ibuprofen (ADVIL) 200 MG tablet Take 600 mg by mouth every 6 (six) hours as needed for fever, headache or moderate pain.   Yes [provider]  famotidine (PEPCID) 20 MG tablet Take 1 tablet (20 mg total) by mouth 2 (two) times daily. Patient not taking: Reported on 12/27/2018 11/05/18   Petrucelli, Glynda Jaeger, PA-C  omeprazole (PRILOSEC) 40 MG capsule Take 1 capsule (40 mg total) by mouth daily. Patient not taking: Reported on 12/27/2018 11/05/18   Petrucelli, Samantha R, PA-C  ondansetron (ZOFRAN ODT) 4 MG disintegrating tablet Take 1 tablet (4 mg total) by mouth every 8 (eight) hours as needed for nausea or vomiting. Patient not taking: Reported on 12/27/2018 11/05/18   Petrucelli, Aldona Bar R, PA-C  sucralfate (CARAFATE) 1 GM/10ML suspension Take 10 mLs (1 g total) by mouth 4 (four) times daily -  with meals and at bedtime. Patient not taking:  Reported on 12/27/2018 11/05/18   Petrucelli, Glynda Jaeger, PA-C    Family History History reviewed. No pertinent family history.  Social History Social History   Tobacco Use   Smoking status: Current Every Day Smoker    Packs/day: 0.50    Types: Cigarettes   Smokeless tobacco: Never Used  Substance Use Topics   Alcohol use: Not Currently    Frequency: Never   Drug use: Never     Allergies   Patient has no known allergies.   Review of Systems Review of Systems All other systems negative except as documented in the HPI. All pertinent positives and negatives as reviewed in the HPI.  Physical Exam Updated Vital Signs BP (!) 173/107 (BP Location: Right Arm)    Pulse 85    Temp 98.5 F (36.9 C) (Oral)    Resp 16    Ht 6\' 3"  (1.905 m)    Wt 111.1 kg    SpO2 99%    BMI 30.62 kg/m   Physical Exam Vitals signs and nursing note reviewed.  Constitutional:      General: He is not in acute distress.    Appearance: He is well-developed.  HENT:     Head: Normocephalic and atraumatic.  Eyes:     Pupils: Pupils are equal, round, and reactive to light.  Neck:     Musculoskeletal: Normal range of motion and neck supple.  Cardiovascular:     Rate and Rhythm: Normal rate and regular rhythm.  Heart sounds: Normal heart sounds. No murmur. No friction rub. No gallop.   Pulmonary:     Effort: Pulmonary effort is normal. No respiratory distress.     Breath sounds: Normal breath sounds. No wheezing.  Abdominal:     General: Bowel sounds are normal. There is no distension.     Palpations: Abdomen is soft.     Tenderness: There is generalized abdominal tenderness. There is no guarding or rebound.  Skin:    General: Skin is warm and dry.     Capillary Refill: Capillary refill takes less than 2 seconds.     Findings: No erythema or rash.  Neurological:     Mental Status: He is alert and oriented to person, place, and time.     Motor: No abnormal muscle tone.     Coordination:  Coordination normal.  Psychiatric:        Behavior: Behavior normal.      ED Treatments / Results  Labs (all labs ordered are listed, but only abnormal results are displayed) Labs Reviewed  LIPASE, BLOOD - Abnormal; Notable for the following components:      Result Value   Lipase 56 (*)    All other components within normal limits  COMPREHENSIVE METABOLIC PANEL - Abnormal; Notable for the following components:   Glucose, Bld 141 (*)    BUN 30 (*)    Creatinine, Ser 1.47 (*)    Total Protein 8.9 (*)    AST 11 (*)    GFR calc non Af Amer 53 (*)    All other components within normal limits  CBC - Abnormal; Notable for the following components:   WBC 21.1 (*)    Hemoglobin 17.4 (*)    HCT 52.9 (*)    Platelets 493 (*)    All other components within normal limits  URINALYSIS, ROUTINE W REFLEX MICROSCOPIC - Abnormal; Notable for the following components:   Ketones, ur 5 (*)    All other components within normal limits    EKG None  Radiology Ct Abdomen Pelvis W Contrast  Result Date: 12/27/2018 CLINICAL DATA:  Abdominal pain and nausea.  Vomiting. EXAM: CT ABDOMEN AND PELVIS WITH CONTRAST TECHNIQUE: Multidetector CT imaging of the abdomen and pelvis was performed using the standard protocol following bolus administration of intravenous contrast. CONTRAST:  164mL OMNIPAQUE IOHEXOL 300 MG/ML  SOLN COMPARISON:  November 05, 2018 and January 09, 2016 FINDINGS: Lower chest: Lung bases are clear. Hepatobiliary: No focal liver lesions are demonstrable. The gallbladder wall is not appreciably thickened. There is no biliary duct dilatation. Pancreas: No pancreatic mass or inflammatory focus. Spleen: No splenic lesions are evident. Adrenals/Urinary Tract: The previously noted left adrenal mass showing washout characteristics indicative of adrenal adenoma is again noted measuring 2.1 x 1.4 cm. Right adrenal appears normal. Kidneys bilaterally show no evident mass or hydronephrosis on either side.  There is no evident renal or ureteral calculus on either side. Urinary bladder is midline with wall thickness somewhat increased. Stomach/Bowel: There is gastric wall thickening which overall has progressed since the most recent study. Note that there was gastric wall thickening present in 2017. No small or large bowel wall thickening is evident. There is no evident bowel obstruction. Terminal ileum appears normal. No free air or portal venous air. Vascular/Lymphatic: There is aortic tortuosity without aneurysm evident. There is aortic and iliac artery atherosclerosis. Major mesenteric arterial vessels appear patent. There is no adenopathy in the abdomen or pelvis. Reproductive: Prostate and seminal vesicles appear  normal in size and contour. No pelvic mass evident. Other: Appendix appears normal. No abscess or ascites is evident in the abdomen or pelvis. Musculoskeletal: There is degenerative change in each hip joint. There are no blastic or lytic bone lesions. There is no intramuscular or abdominal wall lesion evident. IMPRESSION: 1. Gastric wall thickening is noted, somewhat more notable than on the most recent CT examination. Given the degree of wall thickening, particularly proximally, direct visualization with potential tissue sampling may well be warranted. No small or large bowel wall thickening evident. No bowel obstruction. 2. Wall thickening in the urinary bladder is felt to be indicative of a degree of cystitis. Correlation with urinalysis advised. 3.  Apparent left adrenal adenoma, similar to prior studies. 4.  No renal or ureteral calculus.  No hydronephrosis. 5.  Appendix appears normal.  No abscess in the abdomen or pelvis. Electronically Signed   By: Lowella Grip III M.D.   On: 12/27/2018 09:28    Procedures Procedures (including critical care time)  Medications Ordered in ED Medications  sodium chloride (PF) 0.9 % injection (has no administration in time range)  sodium chloride flush  (NS) 0.9 % injection 3 mL (3 mLs Intravenous Given 12/27/18 0716)  ondansetron (ZOFRAN) injection 4 mg (4 mg Intravenous Given 12/27/18 0713)  sodium chloride 0.9 % bolus 1,000 mL (0 mLs Intravenous Stopped 12/27/18 0929)  morphine 4 MG/ML injection 4 mg (4 mg Intravenous Given 12/27/18 1032)  iohexol (OMNIPAQUE) 300 MG/ML solution 100 mL (100 mLs Intravenous Contrast Given 12/27/18 0843)  ondansetron (ZOFRAN) injection 4 mg (4 mg Intravenous Given 12/27/18 1032)     Initial Impression / Assessment and Plan / ED Course  I have reviewed the triage vital signs and the nursing notes.  Pertinent labs & imaging results that were available during my care of the patient were reviewed by me and considered in my medical decision making (see chart for details).        Patient be treated for gastritis.  Have advised patient to return here as needed.  Told to increase his fluid intake and rest as much as possible.  Patient agrees the plan and all questions were answered.  I did advise him that there is thickening in the upper abdomen and this will need further evaluation because this could be a more serious issue.  Final Clinical Impressions(s) / ED Diagnoses   Final diagnoses:  None    ED Discharge Orders    None       Dalia Heading, PA-C 01/01/19 1514    Daleen Bo, MD 01/03/19 1601

## 2018-12-27 NOTE — ED Notes (Signed)
Pt tolerated PO fluids well with no complications.

## 2018-12-27 NOTE — ED Notes (Signed)
When called to be triaged, pt was laying on chair in lobby asleep.

## 2018-12-27 NOTE — Discharge Instructions (Signed)
Follow-up with your doctor.  Also follow-up with the GI specialist provided.  I may need to further evaluate the stomach as it is irritated and inflamed.

## 2018-12-27 NOTE — ED Notes (Signed)
Pt has been made aware that MD needs UA sample. Urinal at bedside.  

## 2018-12-27 NOTE — ED Notes (Signed)
Pt given PO fluids and instructed to try and see if he can tolerate them.

## 2018-12-27 NOTE — ED Triage Notes (Signed)
Patient is complaining of abdominal pain all over, nausea, and vomiting x 4 days. Patient states he can not hold anything on his stomach.

## 2019-01-18 ENCOUNTER — Other Ambulatory Visit: Payer: Self-pay | Admitting: General Surgery

## 2019-01-21 ENCOUNTER — Other Ambulatory Visit (HOSPITAL_COMMUNITY): Payer: Self-pay | Admitting: General Surgery

## 2019-01-21 ENCOUNTER — Other Ambulatory Visit: Payer: Self-pay | Admitting: General Surgery

## 2019-01-21 DIAGNOSIS — C162 Malignant neoplasm of body of stomach: Secondary | ICD-10-CM

## 2019-01-30 ENCOUNTER — Other Ambulatory Visit: Payer: Self-pay

## 2019-01-30 ENCOUNTER — Ambulatory Visit (HOSPITAL_COMMUNITY)
Admission: RE | Admit: 2019-01-30 | Discharge: 2019-01-30 | Disposition: A | Discharge status: Home / Self Care | Payer: Medicaid Other | Source: Ambulatory Visit | Attending: General Surgery | Admitting: General Surgery

## 2019-01-30 DIAGNOSIS — C162 Malignant neoplasm of body of stomach: Secondary | ICD-10-CM | POA: Diagnosis present

## 2019-01-30 MED ORDER — IOHEXOL 300 MG/ML  SOLN
75.0000 mL | Freq: Once | INTRAMUSCULAR | Status: AC | PRN
  Administered 2019-01-30: 75 mL via INTRAVENOUS

## 2019-02-01 ENCOUNTER — Other Ambulatory Visit (HOSPITAL_COMMUNITY): Payer: Self-pay | Admitting: General Surgery

## 2019-02-01 ENCOUNTER — Other Ambulatory Visit: Payer: Self-pay | Admitting: General Surgery

## 2019-02-01 DIAGNOSIS — R9389 Abnormal findings on diagnostic imaging of other specified body structures: Secondary | ICD-10-CM

## 2019-02-05 ENCOUNTER — Ambulatory Visit (HOSPITAL_COMMUNITY)
Admission: RE | Admit: 2019-02-05 | Discharge: 2019-02-05 | Disposition: A | Discharge status: Home / Self Care | Payer: Medicaid Other | Source: Ambulatory Visit | Attending: General Surgery | Admitting: General Surgery

## 2019-02-05 ENCOUNTER — Other Ambulatory Visit: Payer: Self-pay

## 2019-02-05 DIAGNOSIS — R9389 Abnormal findings on diagnostic imaging of other specified body structures: Secondary | ICD-10-CM | POA: Diagnosis present

## 2019-02-05 NOTE — Progress Notes (Signed)
Please let patient know the neck ultrasound looked normal and did not show any evidence of mass or abnormal lymph nodes.

## 2019-03-11 NOTE — Pre-Procedure Instructions (Signed)
[facility] [place], [place] AT [address] [address] Phone: [phone] Fax: [phone]  [id number] - [place], [place] [address] Phone: [phone] Fax: [phone]    Your procedure is scheduled on Tues., Dec. 1, 2020 from 12:00PM-4:00PM  Report to [facility] Entrance "A" at 10:00AM  Call this number if you have problems the morning of surgery:  [phone]   Remember:  Do not eat after midnight on Nov. 30th  You may drink clear liquids until 3 hours (9:00AM) prior to surgery time.  Clear liquids allowed are:  Water, Juice (non-citric and without pulp), Carbonated beverages, Clear Tea, Black Coffee only, Plain Jell-O only, Gatorade and Plain Popsicles only    Take these medicines the morning of surgery with A SIP OF WATER: Omeprazole (PRILOSEC)  If Needed: Acetaminophen (TYLENOL)  As of today, stop taking all Aspirin (unless instructed by your doctor) and Other Aspirin containing products, Vitamins, Fish oils, and Herbal medications. Also stop all NSAIDS i.e. Advil, Ibuprofen, Motrin, Aleve, Anaprox, Naproxen, BC, Goody Powders, and all Supplements.  No Smoking of any kind, Tobacco, or Alcohol products 24 hours prior to your procedure. If you use a Cpap at night, you may bring the machine and all equipment for your overnight stay.    Special instructions:   [facility]- Preparing For Surgery  Before surgery, you can play an important role. Because skin is not sterile, your skin needs to be as free of germs as possible. You can reduce the number of germs on your skin by washing with CHG (chlorahexidine gluconate) Soap before surgery.  CHG is an antiseptic cleaner which kills germs and bonds with the skin to continue killing germs even after washing.    Please do not use if you  have an allergy to CHG or antibacterial soaps. If your skin becomes reddened/irritated stop using the CHG.  Do not shave (including legs and underarms) for at least 48 hours prior to first CHG shower. It is OK to shave your face.  Please follow these instructions carefully.   1. Shower the NIGHT BEFORE SURGERY and the MORNING OF SURGERY with CHG.   2. If you chose to wash your hair, wash your hair first as usual with your normal shampoo.  3. After you shampoo, rinse your hair and body thoroughly to remove the shampoo.  4. Use CHG as you would any other liquid soap. You can apply CHG directly to the skin and wash gently with a scrungie or a clean washcloth.   5. Apply the CHG Soap to your body ONLY FROM THE NECK DOWN.  Do not use on open wounds or open sores. Avoid contact with your eyes, ears, mouth and genitals (private parts). Wash Face and genitals (private parts)  with your normal soap.  6. Wash thoroughly, paying special attention to the area where your surgery will be performed.  7. Thoroughly rinse your body with warm water from the neck down.  8. DO NOT shower/wash with your normal soap after using and rinsing off the CHG Soap.  9. Pat yourself dry with a CLEAN TOWEL.  10. Wear CLEAN PAJAMAS to bed the night before surgery, wear comfortable clothes the morning of surgery  11. Place CLEAN SHEETS on your bed the night of your first shower and DO NOT SLEEP WITH  PETS.   Day of Surgery:            Remember to brush your teeth WITH YOUR REGULAR TOOTHPASTE.  Do not wear jewelry.  Do not wear lotions, powders, colognes, or deodorant.  Do not shave 48 hours prior to surgery.  Men may shave face and neck.  Do not bring valuables to the hospital.  [facility] is not responsible for any belongings or valuables.  Contacts, dentures or bridgework may not be worn into surgery.   For patients admitted to the hospital, discharge time will be determined by your treatment team.  Patients  discharged the day of surgery will not be allowed to drive home, and someone age 26 and over needs to stay with them for 24 hours.  Please wear clean clothes to the hospital/surgery center.    Please read over the following fact sheets that you were given.

## 2019-03-12 ENCOUNTER — Other Ambulatory Visit: Payer: Self-pay

## 2019-03-12 ENCOUNTER — Encounter (HOSPITAL_COMMUNITY)
Admission: RE | Admit: 2019-03-12 | Discharge: 2019-03-12 | Disposition: A | Discharge status: Home / Self Care | Payer: Medicaid Other | Source: Ambulatory Visit | Attending: General Surgery | Admitting: General Surgery

## 2019-03-12 ENCOUNTER — Encounter (HOSPITAL_COMMUNITY): Payer: Self-pay

## 2019-03-12 DIAGNOSIS — Z01818 Encounter for other preprocedural examination: Secondary | ICD-10-CM | POA: Insufficient documentation

## 2019-03-12 LAB — TYPE AND SCREEN
ABO/RH(D): O POS
Antibody Screen: NEGATIVE

## 2019-03-12 LAB — COMPREHENSIVE METABOLIC PANEL
ALT: 13 U/L (ref 0–44)
AST: 17 U/L (ref 15–41)
Albumin: 3.9 g/dL (ref 3.5–5.0)
Alkaline Phosphatase: 100 U/L (ref 38–126)
Anion gap: 8 (ref 5–15)
BUN: 9 mg/dL (ref 6–20)
CO2: 24 mmol/L (ref 22–32)
Calcium: 9.4 mg/dL (ref 8.9–10.3)
Chloride: 108 mmol/L (ref 98–111)
Creatinine, Ser: 0.93 mg/dL (ref 0.61–1.24)
GFR calc Af Amer: >60 mL/min (ref >60)
GFR calc non Af Amer: >60 mL/min (ref >60)
Glucose, Bld: 111 mg/dL — ABNORMAL HIGH (ref 70–99)
Potassium: 4 mmol/L (ref 3.5–5.1)
Sodium: 140 mmol/L (ref 135–145)
Total Bilirubin: 0.4 mg/dL (ref 0.3–1.2)
Total Protein: 7.1 g/dL (ref 6.5–8.1)

## 2019-03-12 LAB — CBC WITH DIFFERENTIAL/PLATELET
Abs Immature Granulocytes: 0.02 10*3/uL (ref 0.00–0.07)
Basophils Absolute: 0.1 10*3/uL (ref 0.0–0.1)
Basophils Relative: 1 %
Eosinophils Absolute: 0.2 10*3/uL (ref 0.0–0.5)
Eosinophils Relative: 2 %
HCT: 45.9 % (ref 39.0–52.0)
Hemoglobin: 13.8 g/dL (ref 13.0–17.0)
Immature Granulocytes: 0 %
Lymphocytes Relative: 31 %
Lymphs Abs: 3 10*3/uL (ref 0.7–4.0)
MCH: 26.7 pg (ref 26.0–34.0)
MCHC: 30.1 g/dL (ref 30.0–36.0)
MCV: 88.8 fL (ref 80.0–100.0)
Monocytes Absolute: 0.6 10*3/uL (ref 0.1–1.0)
Monocytes Relative: 6 %
Neutro Abs: 6.1 10*3/uL (ref 1.7–7.7)
Neutrophils Relative %: 60 %
Platelets: 389 10*3/uL (ref 150–400)
RBC: 5.17 MIL/uL (ref 4.22–5.81)
RDW: 14.1 % (ref 11.5–15.5)
WBC: 10 10*3/uL (ref 4.0–10.5)
nRBC: 0 % (ref 0.0–0.2)

## 2019-03-12 LAB — PROTIME-INR
INR: 1 (ref 0.8–1.2)
Prothrombin Time: 13.5 seconds (ref 11.4–15.2)

## 2019-03-12 LAB — ABO/RH: ABO/RH(D): O POS

## 2019-03-12 NOTE — Progress Notes (Signed)
PCP - denies Cardiologist - denies  Chest x-ray - n/a EKG - 03-12-19  ERAS Protcol - yes, no drink ordered or given   COVID TEST- Friday, Nov. 27th   Anesthesia review: yes, high BP  Per patient, was on BP medication for 2 months when he was incarcerated in 2010.  Hasn't taken any BP medication since.  Patient states he does not have a PCP.  Ebony Hail notified.  Per Ebony Hail, get EKG at PAT appointment. Chart sent to anesthesia for review.  Patient denies shortness of breath, fever, cough and chest pain at PAT appointment   All instructions explained to the patient, with a verbal understanding of the material. Patient agrees to go over the instructions while at home for a better understanding. Patient also instructed to self quarantine after being tested for COVID-19. The opportunity to ask questions was provided.

## 2019-03-13 NOTE — Progress Notes (Signed)
Anesthesia Chart Review: History of untreated HTN. BP at PAT 169/91. BP at ED visit 12/27/18 173/107. Pt reports he was briefly on antiHTN meds while incarcerated in 2010 but no meds since that time. He reported he does not have a PCP and gets most of his care through the ED. Preop EKG shows NSR with rate 68.  Called [facility] to make Dr. Barry Dienes aware of pt's untreated HTN. Severely uncontrolled BP on DOS could be cause for cancellation.   Preop labs reviewed, WNL.    [facility]/Anesthesiology Phone [phone] 03/13/2019 1:28 PM

## 2019-03-13 NOTE — Anesthesia Preprocedure Evaluation (Addendum)
Anesthesia Evaluation  Patient identified by MRN, date of birth, ID band Patient awake    Reviewed: Allergy & Precautions, NPO status , Patient's Chart, lab work & pertinent test results  Airway Mallampati: I  TM Distance: >3 FB Neck ROM: Full    Dental  (+) Missing, Dental Advisory Given, Poor Dentition, Loose,    Pulmonary Current Smoker,    breath sounds clear to auscultation       Cardiovascular negative cardio ROS   Rhythm:Regular Rate:Normal     Neuro/Psych negative neurological ROS  negative psych ROS   GI/Hepatic negative GI ROS, Neg liver ROS,   Endo/Other  negative endocrine ROS  Renal/GU negative Renal ROS     Musculoskeletal negative musculoskeletal ROS (+)   Abdominal Normal abdominal exam  (+)   Peds  Hematology negative hematology ROS (+)   Anesthesia Other Findings   Reproductive/Obstetrics                          Anesthesia Physical Anesthesia Plan  ASA: II  Anesthesia Plan: General   Post-op Pain Management:    Induction: Intravenous  PONV Risk Score and Plan: 2 and Ondansetron, Dexamethasone and Midazolam  Airway Management Planned: Oral ETT  Additional Equipment: Arterial line  Intra-op Plan:   Post-operative Plan: Possible Post-op intubation/ventilation  Informed Consent: I have reviewed the patients History and Physical, chart, labs and discussed the procedure including the risks, benefits and alternatives for the proposed anesthesia with the patient or authorized representative who has indicated his/her understanding and acceptance.     Dental advisory given  Plan Discussed with: CRNA  Anesthesia Plan Comments: (History of untreated HTN. BP at PAT 169/91. BP at ED visit 12/27/18 173/107. Pt reports he was briefly on antiHTN meds while incarcerated in 2010 but no meds since that time. He reported he does not have a PCP and gets most of his care through  the ED. Preop EKG shows NSR with rate 68.  Called [facility] to make Dr. Barry Dienes aware of pt's untreated HTN. Severely uncontrolled BP on DOS could be cause for cancellation.   Preop labs reviewed, WNL.  )      Anesthesia Quick Evaluation

## 2019-03-15 ENCOUNTER — Other Ambulatory Visit (HOSPITAL_COMMUNITY)
Admission: RE | Admit: 2019-03-15 | Discharge: 2019-03-15 | Disposition: A | Discharge status: Home / Self Care | Payer: Medicaid Other | Source: Ambulatory Visit | Attending: General Surgery | Admitting: General Surgery

## 2019-03-15 DIAGNOSIS — Z20828 Contact with and (suspected) exposure to other viral communicable diseases: Secondary | ICD-10-CM | POA: Diagnosis not present

## 2019-03-15 DIAGNOSIS — Z01812 Encounter for preprocedural laboratory examination: Secondary | ICD-10-CM | POA: Diagnosis not present

## 2019-03-15 LAB — SARS CORONAVIRUS 2 (TAT 6-24 HRS): SARS Coronavirus 2: NEGATIVE

## 2019-03-18 NOTE — H&P (Signed)
Evan Brown Location: [facility] Surgery Patient #: [id number] DOB: 03-16-1966 Single / Language: Cleophus Brown / Race: Black or African American Male   History of Present Illness  The patient is a 53 year old male who presents with gastric cancer. Pt is a 53 yo M referred by Dr. Watt Climes for a new diagnosis of gastric cancer 12/2018. He presented with abdominal pain in July 2020. He previously had had gastritis and this CT looked improved. He continued to lose weight and had severe pain so returned to the ED 12/27/2018. This CT showed more thickening at the lesser curve. He subsequently had EGD wtih Dr. Watt Climes. He was seen to have a cratered ulcer on the lesser curve as well. 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 He is being treated for the H pylori and is feeling better. He also has stopped drinking all alcohol. He is down to 4 cigarettes per day. He has lost around 35 pounds in total. He has no personal history of cancer, but he had a brother with cancer.   CT abd/pelvis 10/2018  IMPRESSION: Slight increase in size of a LEFT adrenal mass with washout characteristics consistent with an adrenal adenoma.  Significantly improved gastric wall thickening since the previous study with only a single segment of questionable wall thickening at the lesser curve of the mid stomach, cannot exclude gastritis or mass; follow-up upper endoscopy or upper GI assessment recommended.  Cholelithiasis.   CT 12/27/2018 IMPRESSION: 1. Gastric wall thickening is noted, somewhat more notable than on the most recent CT examination. Given the degree of wall thickening, particularly proximally, direct visualization with potential tissue sampling may well be warranted. No small or large bowel wall thickening evident. No bowel obstruction.  2. Wall thickening in the urinary bladder is felt to be indicative of a degree of cystitis. Correlation with urinalysis advised.  3. Apparent  left adrenal adenoma, similar to prior studies.  4. No renal or ureteral calculus. No hydronephrosis.  5. Appendix appears normal. No abscess in the abdomen or pelvis.    Past Surgical History No pertinent past surgical history   Diagnostic Studies History Colonoscopy  never  Allergies No Known Drug Allergies   Medication History Pepcid (20MG  Tablet, Oral) Active. Advil (200MG  Tablet, Oral) Active. Omeprazole (40MG  Capsule DR, Oral) Active. Zofran (4MG  Tablet, Oral) Active. Promethazine HCl (25MG  Tablet, Oral) Active. Carafate (1GM/10ML Suspension, Oral) Active. traMADol HCl (50MG  Tablet, Oral) Active. Medications Reconciled  Social History  Alcohol use  Recently quit alcohol use. Caffeine use  Carbonated beverages, Coffee. Illicit drug use  Prefer to discuss with provider. Tobacco use  Current every day smoker.  Family History Alcohol Abuse  Brother, Mother. Cancer  Brother. Diabetes Mellitus  Mother, Sister. Hypertension  Mother, Sister. Kidney Disease  Mother. Seizure disorder  Brother. Thyroid problems  Brother.  Other Problems Arthritis  Cancer  Gastric Ulcer     Review of Systems General Present- Fatigue. Not Present- Appetite Loss, Chills, Fever, Night Sweats, Weight Gain and Weight Loss. HEENT Not Present- Earache, Hearing Loss, Hoarseness, Nose Bleed, Oral Ulcers, Ringing in the Ears, Seasonal Allergies, Sinus Pain, Sore Throat, Visual Disturbances, Wears glasses/contact lenses and Yellow Eyes. Respiratory Present- Chronic Cough and Snoring. Not Present- Bloody sputum, Difficulty Breathing and Wheezing. Cardiovascular Present- Rapid Heart Rate. Not Present- Chest Pain, Difficulty Breathing Lying Down, Leg Cramps, Palpitations, Shortness of Breath and Swelling of Extremities. Gastrointestinal Present- Abdominal Pain, Change in Bowel Habits and Gets full quickly at meals. Not Present-  Bloating, Bloody Stool, Chronic diarrhea,  Constipation, Difficulty Swallowing, Excessive gas, Hemorrhoids, Indigestion, Nausea, Rectal Pain and Vomiting. Male Genitourinary Present- Nocturia. Not Present- Blood in Urine, Change in Urinary Stream, Frequency, Impotence, Painful Urination, Urgency and Urine Leakage. Musculoskeletal Not Present- Back Pain, Joint Pain, Joint Stiffness, Muscle Pain, Muscle Weakness and Swelling of Extremities. Neurological Present- Headaches. Not Present- Decreased Memory, Fainting, Numbness, Seizures, Tingling, Tremor, Trouble walking and Weakness. Psychiatric Present- Change in Sleep Pattern. Not Present- Anxiety, Bipolar, Depression, Fearful and Frequent crying. Endocrine Not Present- Cold Intolerance, Excessive Hunger, Hair Changes, Heat Intolerance, Hot flashes and New Diabetes. Hematology Not Present- Blood Thinners, Easy Bruising, Excessive bleeding, Gland problems, HIV and Persistent Infections.  Vitals Weight: 209.5 lb Height: 75in Body Surface Area: 2.24 m Body Mass Index: 26.19 kg/m  Temp.: 98.74F(Oral)  Pulse: 89 (Regular)  P.OX: 98% (Room air) BP: 138/82 (Sitting, Left Arm, Standard)       Physical Exam General Mental Status-Alert. General Appearance-Consistent with stated age. Hydration-Well hydrated. Voice-Normal.  Head and Neck Head-normocephalic, atraumatic with no lesions or palpable masses. Trachea-midline. Thyroid Gland Characteristics - normal size and consistency.  Eye Eyeball - Bilateral-Extraocular movements intact. Sclera/Conjunctiva - Bilateral-No scleral icterus.  Chest and Lung Exam Chest and lung exam reveals -quiet, even and easy respiratory effort with no use of accessory muscles and on auscultation, normal breath sounds, no adventitious sounds and normal vocal resonance. Inspection Chest Wall - Normal. Back - normal.  Cardiovascular Cardiovascular examination reveals -normal heart sounds, regular rate and rhythm with no  murmurs and normal pedal pulses bilaterally.  Abdomen Inspection Inspection of the abdomen reveals - No Hernias. Palpation/Percussion Palpation and Percussion of the abdomen reveal - Soft, Non Tender, No Rebound tenderness, No Rigidity (guarding) and No hepatosplenomegaly. Auscultation Auscultation of the abdomen reveals - Bowel sounds normal.  Neurologic Neurologic evaluation reveals -alert and oriented x 3 with no impairment of recent or remote memory. Mental Status-Normal.  Musculoskeletal Global Assessment -Note: no gross deformities.  Normal Exam - Left-Upper Extremity Strength Normal and Lower Extremity Strength Normal. Normal Exam - Right-Upper Extremity Strength Normal and Lower Extremity Strength Normal.  Lymphatic Head & Neck  General Head & Neck Lymphatics: Bilateral - Description - Normal. Axillary  General Axillary Region: Bilateral - Description - Normal. Tenderness - Non Tender. Femoral & Inguinal  Generalized Femoral & Inguinal Lymphatics: Bilateral - Description - No Generalized lymphadenopathy.    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 Will plan for dx laparoscopy, partial gastrectomy, feeding tube placement. Reviewed surgery with patient and sister including diagrams of anatomy. Discussed risks including bleeding, infection, damage to adjacent structures, heart or lung complications, hernia, blood clot, prolonged nausea, feeding tube issues.  The time in the hospital is reviewed as well as post op expectations and recovery.  He will need to see social work in the hospital. Current Plans You are being scheduled for surgery- Our schedulers will call you.  You should hear from our office's scheduling department within 5 working days about the location, date, and time of surgery. We try to make accommodations for patient's preferences in scheduling surgery, but sometimes  the OR schedule or the surgeon's schedule prevents [place] from making those accommodations.  If you have not heard from our office [phone]) in 5 working days, call the office and ask for your surgeon's nurse.  If you have other questions about your diagnosis, plan, or surgery, call the office  and ask for your surgeon's nurse.  CT CHEST W CON ([id number]) (gastric cancer, initial staging) Pt Education - Stomach Cancer (Gastric Cancer): gastric cancer   Signed electronically by [name], MD

## 2019-03-19 ENCOUNTER — Inpatient Hospital Stay (HOSPITAL_COMMUNITY)
Admission: RE | Admit: 2019-03-19 | Discharge: 2019-03-26 | DRG: 327 | Disposition: A | Discharge status: Home Health | Payer: Medicaid Other | Attending: General Surgery | Admitting: General Surgery

## 2019-03-19 ENCOUNTER — Other Ambulatory Visit: Payer: Self-pay

## 2019-03-19 ENCOUNTER — Encounter (HOSPITAL_COMMUNITY): Payer: Self-pay | Admitting: Anesthesiology

## 2019-03-19 ENCOUNTER — Encounter (HOSPITAL_COMMUNITY): Payer: Self-pay | Admitting: Certified Registered Nurse Anesthetist

## 2019-03-19 ENCOUNTER — Encounter (HOSPITAL_COMMUNITY)
Admission: RE | Disposition: A | Discharge status: Home Health | Payer: Self-pay | Source: Home / Self Care | Attending: General Surgery

## 2019-03-19 ENCOUNTER — Inpatient Hospital Stay (HOSPITAL_COMMUNITY): Payer: Medicaid Other

## 2019-03-19 ENCOUNTER — Inpatient Hospital Stay (HOSPITAL_COMMUNITY): Payer: Medicaid Other | Admitting: Vascular Surgery

## 2019-03-19 DIAGNOSIS — K660 Peritoneal adhesions (postprocedural) (postinfection): Secondary | ICD-10-CM | POA: Diagnosis present

## 2019-03-19 DIAGNOSIS — C162 Malignant neoplasm of body of stomach: Secondary | ICD-10-CM | POA: Diagnosis present

## 2019-03-19 DIAGNOSIS — I10 Essential (primary) hypertension: Secondary | ICD-10-CM | POA: Diagnosis present

## 2019-03-19 DIAGNOSIS — D638 Anemia in other chronic diseases classified elsewhere: Secondary | ICD-10-CM | POA: Diagnosis present

## 2019-03-19 DIAGNOSIS — Z6826 Body mass index (BMI) 26.0-26.9, adult: Secondary | ICD-10-CM | POA: Diagnosis not present

## 2019-03-19 DIAGNOSIS — Z79899 Other long term (current) drug therapy: Secondary | ICD-10-CM | POA: Diagnosis not present

## 2019-03-19 DIAGNOSIS — F17210 Nicotine dependence, cigarettes, uncomplicated: Secondary | ICD-10-CM | POA: Diagnosis present

## 2019-03-19 DIAGNOSIS — C169 Malignant neoplasm of stomach, unspecified: Secondary | ICD-10-CM | POA: Diagnosis present

## 2019-03-19 DIAGNOSIS — E440 Moderate protein-calorie malnutrition: Secondary | ICD-10-CM | POA: Diagnosis present

## 2019-03-19 DIAGNOSIS — D62 Acute posthemorrhagic anemia: Secondary | ICD-10-CM | POA: Diagnosis not present

## 2019-03-19 DIAGNOSIS — Z20828 Contact with and (suspected) exposure to other viral communicable diseases: Secondary | ICD-10-CM | POA: Diagnosis present

## 2019-03-19 DIAGNOSIS — K219 Gastro-esophageal reflux disease without esophagitis: Secondary | ICD-10-CM | POA: Diagnosis present

## 2019-03-19 DIAGNOSIS — F1990 Other psychoactive substance use, unspecified, uncomplicated: Secondary | ICD-10-CM | POA: Diagnosis present

## 2019-03-19 HISTORY — PX: PARTIAL GASTRECTOMY: SHX6003

## 2019-03-19 HISTORY — DX: Malignant neoplasm of stomach, unspecified: C16.9

## 2019-03-19 HISTORY — PX: GASTROSTOMY: SHX5249

## 2019-03-19 HISTORY — PX: LAPAROSCOPY: SHX197

## 2019-03-19 SURGERY — LAPAROSCOPY, DIAGNOSTIC
Anesthesia: General | Site: Abdomen

## 2019-03-19 MED ORDER — PROPOFOL 10 MG/ML IV BOLUS
INTRAVENOUS | Status: DC | PRN
  Administered 2019-03-19: 180 mg via INTRAVENOUS

## 2019-03-19 MED ORDER — METHOCARBAMOL 1000 MG/10ML IJ SOLN
500.0000 mg | Freq: Three times a day (TID) | INTRAVENOUS | Status: DC | PRN
  Filled 2019-03-19 (×2): qty 5

## 2019-03-19 MED ORDER — LACTATED RINGERS IV SOLN
INTRAVENOUS | Status: DC
  Administered 2019-03-19 (×2): via INTRAVENOUS

## 2019-03-19 MED ORDER — HYDROMORPHONE 1 MG/ML IV SOLN
INTRAVENOUS | Status: DC
  Administered 2019-03-19: 0 mL via INTRAVENOUS
  Administered 2019-03-19: 30 mg via INTRAVENOUS
  Administered 2019-03-20: 2.1 mg via INTRAVENOUS
  Administered 2019-03-20: 2.7 mg via INTRAVENOUS
  Administered 2019-03-20: 3.9 mg via INTRAVENOUS
  Administered 2019-03-21: 0.3 mg via INTRAVENOUS
  Administered 2019-03-21: 0.9 mg via INTRAVENOUS
  Administered 2019-03-21: 1.5 mg via INTRAVENOUS
  Administered 2019-03-22: 0.6 mg via INTRAVENOUS
  Administered 2019-03-22: 0.3 mg via INTRAVENOUS
  Filled 2019-03-19: qty 30

## 2019-03-19 MED ORDER — CEFAZOLIN SODIUM-DEXTROSE 2-4 GM/100ML-% IV SOLN
2.0000 g | INTRAVENOUS | Status: AC
  Administered 2019-03-19: 2 g via INTRAVENOUS

## 2019-03-19 MED ORDER — DIPHENHYDRAMINE HCL 50 MG/ML IJ SOLN: 12.5000 mg | Freq: Four times a day (QID) | INTRAMUSCULAR | Status: DC | PRN

## 2019-03-19 MED ORDER — ACETAMINOPHEN 325 MG PO TABS: 325.0000 mg | ORAL_TABLET | Freq: Once | ORAL | Status: DC | PRN

## 2019-03-19 MED ORDER — MIDAZOLAM HCL 2 MG/2ML IJ SOLN
INTRAMUSCULAR | Status: AC
  Filled 2019-03-19: qty 2

## 2019-03-19 MED ORDER — PHENYLEPHRINE 40 MCG/ML (10ML) SYRINGE FOR IV PUSH (FOR BLOOD PRESSURE SUPPORT)
PREFILLED_SYRINGE | INTRAVENOUS | Status: AC
  Filled 2019-03-19: qty 10

## 2019-03-19 MED ORDER — SUGAMMADEX SODIUM 200 MG/2ML IV SOLN
INTRAVENOUS | Status: DC | PRN
  Administered 2019-03-19: 200 mg via INTRAVENOUS

## 2019-03-19 MED ORDER — DIPHENHYDRAMINE HCL 12.5 MG/5ML PO ELIX: 12.5000 mg | ORAL_SOLUTION | Freq: Four times a day (QID) | ORAL | Status: DC | PRN

## 2019-03-19 MED ORDER — ONDANSETRON HCL 4 MG/2ML IJ SOLN
INTRAMUSCULAR | Status: AC
  Filled 2019-03-19: qty 2

## 2019-03-19 MED ORDER — FENTANYL CITRATE (PF) 100 MCG/2ML IJ SOLN
INTRAMUSCULAR | Status: DC | PRN
  Administered 2019-03-19: 25 ug via INTRAVENOUS
  Administered 2019-03-19: 50 ug via INTRAVENOUS
  Administered 2019-03-19: 25 ug via EPIDURAL
  Administered 2019-03-19: 100 ug via INTRAVENOUS
  Administered 2019-03-19: 50 ug via INTRAVENOUS

## 2019-03-19 MED ORDER — LIDOCAINE-EPINEPHRINE 1 %-1:100000 IJ SOLN
INTRAMUSCULAR | Status: DC | PRN
  Administered 2019-03-19: 20 mL

## 2019-03-19 MED ORDER — BUPIVACAINE HCL 0.25 % IJ SOLN
INTRAMUSCULAR | Status: DC | PRN
  Administered 2019-03-19: 30 mL

## 2019-03-19 MED ORDER — HYDROMORPHONE HCL 1 MG/ML IJ SOLN: 0.2500 mg | INTRAMUSCULAR | Status: DC | PRN

## 2019-03-19 MED ORDER — LACTATED RINGERS IV SOLN
INTRAVENOUS | Status: DC | PRN
  Administered 2019-03-19: 11:00:00 via INTRAVENOUS

## 2019-03-19 MED ORDER — ESMOLOL HCL 100 MG/10ML IV SOLN
INTRAVENOUS | Status: DC | PRN
  Administered 2019-03-19: 10 mg via INTRAVENOUS
  Administered 2019-03-19: 20 mg via INTRAVENOUS

## 2019-03-19 MED ORDER — LIDOCAINE 2% (20 MG/ML) 5 ML SYRINGE
INTRAMUSCULAR | Status: AC
  Filled 2019-03-19: qty 5

## 2019-03-19 MED ORDER — NALOXONE HCL 0.4 MG/ML IJ SOLN: 0.4000 mg | INTRAMUSCULAR | Status: DC | PRN

## 2019-03-19 MED ORDER — FENTANYL CITRATE (PF) 250 MCG/5ML IJ SOLN
INTRAMUSCULAR | Status: AC
  Filled 2019-03-19: qty 5

## 2019-03-19 MED ORDER — CHLORHEXIDINE GLUCONATE CLOTH 2 % EX PADS: 6.0000 | MEDICATED_PAD | Freq: Once | CUTANEOUS | Status: DC

## 2019-03-19 MED ORDER — ACETAMINOPHEN 10 MG/ML IV SOLN
1000.0000 mg | Freq: Four times a day (QID) | INTRAVENOUS | Status: AC
  Administered 2019-03-19 – 2019-03-20 (×4): 1000 mg via INTRAVENOUS
  Filled 2019-03-19 (×4): qty 100

## 2019-03-19 MED ORDER — MEPERIDINE HCL 25 MG/ML IJ SOLN: 6.2500 mg | INTRAMUSCULAR | Status: DC | PRN

## 2019-03-19 MED ORDER — STERILE WATER FOR IRRIGATION IR SOLN
Status: DC | PRN
  Administered 2019-03-19: 1000 mL

## 2019-03-19 MED ORDER — LIDOCAINE-EPINEPHRINE 1 %-1:100000 IJ SOLN
INTRAMUSCULAR | Status: AC
  Filled 2019-03-19: qty 1

## 2019-03-19 MED ORDER — SODIUM CHLORIDE 0.9 % IR SOLN
Status: DC | PRN
  Administered 2019-03-19 (×2): 2000 mL

## 2019-03-19 MED ORDER — KCL IN DEXTROSE-NACL 20-5-0.45 MEQ/L-%-% IV SOLN
INTRAVENOUS | Status: DC
  Administered 2019-03-19 – 2019-03-23 (×6): via INTRAVENOUS
  Filled 2019-03-19 (×2): qty 1000
  Filled 2019-03-19: qty 2000
  Filled 2019-03-19 (×5): qty 1000

## 2019-03-19 MED ORDER — MIDAZOLAM HCL 5 MG/5ML IJ SOLN
INTRAMUSCULAR | Status: DC | PRN
  Administered 2019-03-19: 2 mg via INTRAVENOUS

## 2019-03-19 MED ORDER — BUPIVACAINE HCL (PF) 0.25 % IJ SOLN
INTRAMUSCULAR | Status: DC | PRN
  Administered 2019-03-19: 10 mL via EPIDURAL
  Administered 2019-03-19: 5 mL via EPIDURAL

## 2019-03-19 MED ORDER — PANTOPRAZOLE SODIUM 40 MG IV SOLR
40.0000 mg | Freq: Every day | INTRAVENOUS | Status: DC
  Administered 2019-03-19: 40 mg via INTRAVENOUS
  Filled 2019-03-19: qty 40

## 2019-03-19 MED ORDER — ACETAMINOPHEN 500 MG PO TABS
ORAL_TABLET | ORAL | Status: AC
  Administered 2019-03-19: 1000 mg via ORAL
  Filled 2019-03-19: qty 2

## 2019-03-19 MED ORDER — PROPOFOL 10 MG/ML IV BOLUS
INTRAVENOUS | Status: AC
  Filled 2019-03-19: qty 20

## 2019-03-19 MED ORDER — ONDANSETRON HCL 4 MG/2ML IJ SOLN
INTRAMUSCULAR | Status: DC | PRN
  Administered 2019-03-19: 4 mg via INTRAVENOUS

## 2019-03-19 MED ORDER — DEXAMETHASONE SODIUM PHOSPHATE 10 MG/ML IJ SOLN
INTRAMUSCULAR | Status: DC | PRN
  Administered 2019-03-19: 5 mg via INTRAVENOUS

## 2019-03-19 MED ORDER — ROCURONIUM BROMIDE 50 MG/5ML IV SOSY: PREFILLED_SYRINGE | INTRAVENOUS | Status: DC | PRN

## 2019-03-19 MED ORDER — PHENYLEPHRINE HCL (PRESSORS) 10 MG/ML IV SOLN
INTRAVENOUS | Status: DC | PRN
  Administered 2019-03-19 (×5): 80 ug via INTRAVENOUS

## 2019-03-19 MED ORDER — SODIUM CHLORIDE 0.9% FLUSH: 9.0000 mL | INTRAVENOUS | Status: DC | PRN

## 2019-03-19 MED ORDER — CEFAZOLIN SODIUM-DEXTROSE 2-4 GM/100ML-% IV SOLN
2.0000 g | Freq: Three times a day (TID) | INTRAVENOUS | Status: AC
  Administered 2019-03-19: 2 g via INTRAVENOUS
  Filled 2019-03-19: qty 100

## 2019-03-19 MED ORDER — PHENYLEPHRINE HCL-NACL 10-0.9 MG/250ML-% IV SOLN
INTRAVENOUS | Status: DC | PRN
  Administered 2019-03-19: 50 ug/min via INTRAVENOUS

## 2019-03-19 MED ORDER — METOPROLOL TARTRATE 5 MG/5ML IV SOLN
5.0000 mg | Freq: Four times a day (QID) | INTRAVENOUS | Status: DC | PRN
  Administered 2019-03-20: 5 mg via INTRAVENOUS
  Filled 2019-03-19 (×2): qty 5

## 2019-03-19 MED ORDER — CEFAZOLIN SODIUM-DEXTROSE 2-4 GM/100ML-% IV SOLN
INTRAVENOUS | Status: AC
  Filled 2019-03-19: qty 100

## 2019-03-19 MED ORDER — PROCHLORPERAZINE EDISYLATE 10 MG/2ML IJ SOLN: 5.0000 mg | Freq: Four times a day (QID) | INTRAMUSCULAR | Status: DC | PRN

## 2019-03-19 MED ORDER — EPHEDRINE 5 MG/ML INJ
INTRAVENOUS | Status: AC
  Filled 2019-03-19: qty 10

## 2019-03-19 MED ORDER — ESMOLOL HCL 100 MG/10ML IV SOLN
INTRAVENOUS | Status: AC
  Filled 2019-03-19: qty 10

## 2019-03-19 MED ORDER — ROCURONIUM BROMIDE 100 MG/10ML IV SOLN
INTRAVENOUS | Status: DC | PRN
  Administered 2019-03-19: 40 mg via INTRAVENOUS
  Administered 2019-03-19: 60 mg via INTRAVENOUS
  Administered 2019-03-19: 15 mg via INTRAVENOUS
  Administered 2019-03-19 (×2): 20 mg via INTRAVENOUS

## 2019-03-19 MED ORDER — LACTATED RINGERS IV SOLN: INTRAVENOUS | Status: DC

## 2019-03-19 MED ORDER — LIDOCAINE 2% (20 MG/ML) 5 ML SYRINGE
INTRAMUSCULAR | Status: DC | PRN
  Administered 2019-03-19: 40 mg via INTRAVENOUS

## 2019-03-19 MED ORDER — ACETAMINOPHEN 10 MG/ML IV SOLN: 1000.0000 mg | Freq: Once | INTRAVENOUS | Status: DC | PRN

## 2019-03-19 MED ORDER — ROCURONIUM BROMIDE 10 MG/ML (PF) SYRINGE
PREFILLED_SYRINGE | INTRAVENOUS | Status: AC
  Filled 2019-03-19: qty 10

## 2019-03-19 MED ORDER — ONDANSETRON HCL 4 MG/2ML IJ SOLN: 4.0000 mg | Freq: Four times a day (QID) | INTRAMUSCULAR | Status: DC | PRN

## 2019-03-19 MED ORDER — EPHEDRINE SULFATE 50 MG/ML IJ SOLN
INTRAMUSCULAR | Status: DC | PRN
  Administered 2019-03-19 (×2): 5 mg via INTRAVENOUS

## 2019-03-19 MED ORDER — DEXAMETHASONE SODIUM PHOSPHATE 10 MG/ML IJ SOLN
INTRAMUSCULAR | Status: AC
  Filled 2019-03-19: qty 1

## 2019-03-19 MED ORDER — BUPIVACAINE HCL (PF) 0.25 % IJ SOLN
INTRAMUSCULAR | Status: AC
  Filled 2019-03-19: qty 30

## 2019-03-19 MED ORDER — PROMETHAZINE HCL 25 MG/ML IJ SOLN: 6.2500 mg | INTRAMUSCULAR | Status: DC | PRN

## 2019-03-19 MED ORDER — PROCHLORPERAZINE MALEATE 10 MG PO TABS
10.0000 mg | ORAL_TABLET | Freq: Four times a day (QID) | ORAL | Status: DC | PRN
  Filled 2019-03-19: qty 1

## 2019-03-19 MED ORDER — ROPIVACAINE HCL 2 MG/ML IJ SOLN
10.0000 mL/h | INTRAMUSCULAR | Status: DC
  Administered 2019-03-19: 10 mL/h via EPIDURAL
  Filled 2019-03-19 (×3): qty 200

## 2019-03-19 MED ORDER — ACETAMINOPHEN 500 MG PO TABS
1000.0000 mg | ORAL_TABLET | ORAL | Status: AC
  Administered 2019-03-19: 10:00:00 1000 mg via ORAL

## 2019-03-19 MED ORDER — ACETAMINOPHEN 160 MG/5ML PO SOLN: 325.0000 mg | Freq: Once | ORAL | Status: DC | PRN

## 2019-03-19 MED ORDER — SUCCINYLCHOLINE CHLORIDE 200 MG/10ML IV SOSY
PREFILLED_SYRINGE | INTRAVENOUS | Status: AC
  Filled 2019-03-19: qty 10

## 2019-03-19 SURGICAL SUPPLY — 87 items
APL PRP STRL LF DISP 70% ISPRP (MISCELLANEOUS) ×1
BIOPATCH RED 1 DISK 7.0 (GAUZE/BANDAGES/DRESSINGS) IMPLANT
BIOPATCH RED 1IN DISK 7.0MM (GAUZE/BANDAGES/DRESSINGS)
BLADE CLIPPER SURG (BLADE) ×3 IMPLANT
CANISTER SUCT 3000ML PPV (MISCELLANEOUS) ×6 IMPLANT
CATH KIT ON-Q SILVERSOAK 7.5IN (CATHETERS) IMPLANT
CHLORAPREP W/TINT 26 (MISCELLANEOUS) ×3 IMPLANT
CLIP VESOCCLUDE LG 6/CT (CLIP) ×3 IMPLANT
CLIP VESOCCLUDE MED 24/CT (CLIP) ×3 IMPLANT
CLIP VESOLOCK LG 6/CT PURPLE (CLIP) IMPLANT
CLIP VESOLOCK MED 6/CT (CLIP) ×3 IMPLANT
CLIP VESOLOCK MED LG 6/CT (CLIP) IMPLANT
COVER SURGICAL LIGHT HANDLE (MISCELLANEOUS) ×3 IMPLANT
COVER WAND RF STERILE (DRAPES) IMPLANT
CUTTER FLEX LINEAR 45M (STAPLE) ×3 IMPLANT
DECANTER SPIKE VIAL GLASS SM (MISCELLANEOUS) ×6 IMPLANT
DERMABOND ADVANCED (GAUZE/BANDAGES/DRESSINGS) ×2
DERMABOND ADVANCED .7 DNX12 (GAUZE/BANDAGES/DRESSINGS) ×1 IMPLANT
DRAIN CHANNEL 19F RND (DRAIN) IMPLANT
DRAIN PENROSE 18X1/2 LTX STRL (DRAIN) IMPLANT
DRAPE LAPAROSCOPIC ABDOMINAL (DRAPES) ×3 IMPLANT
DRAPE UTILITY XL STRL (DRAPES) IMPLANT
DRAPE WARM FLUID 44X44 (DRAPES) ×3 IMPLANT
DRSG COVADERM 4X10 (GAUZE/BANDAGES/DRESSINGS) ×3 IMPLANT
DRSG COVADERM 4X8 (GAUZE/BANDAGES/DRESSINGS) IMPLANT
DRSG TEGADERM 4X4.75 (GAUZE/BANDAGES/DRESSINGS) IMPLANT
ELECT BLADE 6.5 EXT (BLADE) ×3 IMPLANT
ELECT REM PT RETURN 9FT ADLT (ELECTROSURGICAL) ×3
ELECTRODE REM PT RTRN 9FT ADLT (ELECTROSURGICAL) ×1 IMPLANT
EVACUATOR SILICONE 100CC (DRAIN) IMPLANT
GAUZE SPONGE 4X4 12PLY STRL (GAUZE/BANDAGES/DRESSINGS) ×3 IMPLANT
GLOVE BIO SURGEON STRL SZ 6 (GLOVE) ×6 IMPLANT
GLOVE BIO SURGEON STRL SZ 6.5 (GLOVE) ×2 IMPLANT
GLOVE BIO SURGEON STRL SZ7 (GLOVE) ×3 IMPLANT
GLOVE BIO SURGEONS STRL SZ 6.5 (GLOVE) ×1
GLOVE INDICATOR 6.5 STRL GRN (GLOVE) ×6 IMPLANT
GOWN STRL REUS W/ TWL LRG LVL3 (GOWN DISPOSABLE) ×2 IMPLANT
GOWN STRL REUS W/TWL 2XL LVL3 (GOWN DISPOSABLE) ×6 IMPLANT
GOWN STRL REUS W/TWL LRG LVL3 (GOWN DISPOSABLE) ×4
KIT BASIN OR (CUSTOM PROCEDURE TRAY) ×3 IMPLANT
KIT TUBE JEJUNAL 16FR (CATHETERS) ×3 IMPLANT
KIT TURNOVER KIT B (KITS) ×3 IMPLANT
L-HOOK LAP DISP 36CM (ELECTROSURGICAL) ×3
LHOOK LAP DISP 36CM (ELECTROSURGICAL) ×1 IMPLANT
NS IRRIG 1000ML POUR BTL (IV SOLUTION) ×6 IMPLANT
PACK GENERAL/GYN (CUSTOM PROCEDURE TRAY) ×3 IMPLANT
PAD ARMBOARD 7.5X6 YLW CONV (MISCELLANEOUS) ×6 IMPLANT
PENCIL BUTTON HOLSTER BLD 10FT (ELECTRODE) ×3 IMPLANT
PENCIL SMOKE EVACUATOR (MISCELLANEOUS) ×3 IMPLANT
RELOAD 45 VASCULAR/THIN (ENDOMECHANICALS) ×6 IMPLANT
RELOAD PROXIMATE 75MM BLUE (ENDOMECHANICALS) ×15 IMPLANT
SCISSORS LAP 5X35 DISP (ENDOMECHANICALS) IMPLANT
SET IRRIG TUBING LAPAROSCOPIC (IRRIGATION / IRRIGATOR) IMPLANT
SET TUBE SMOKE EVAC HIGH FLOW (TUBING) ×3 IMPLANT
SHEARS FOC LG CVD HARMONIC 17C (MISCELLANEOUS) ×3 IMPLANT
SLEEVE ENDOPATH XCEL 5M (ENDOMECHANICALS) ×3 IMPLANT
SLEEVE SUCTION CATH 165 (SLEEVE) ×3 IMPLANT
SPECIMEN JAR LARGE (MISCELLANEOUS) ×3 IMPLANT
SPECIMEN JAR X LARGE (MISCELLANEOUS) ×3 IMPLANT
SPONGE GAUZE 2X2 8PLY STER LF (GAUZE/BANDAGES/DRESSINGS) ×1
SPONGE GAUZE 2X2 8PLY STRL LF (GAUZE/BANDAGES/DRESSINGS) ×2 IMPLANT
SPONGE LAP 18X18 RF (DISPOSABLE) ×3 IMPLANT
SPONGE LAP 18X18 X RAY DECT (DISPOSABLE) ×6 IMPLANT
STAPLE ECHEON FLEX 60 POW ENDO (STAPLE) IMPLANT
STAPLER PROXIMATE 75MM BLUE (STAPLE) ×3 IMPLANT
STAPLER VISISTAT 35W (STAPLE) ×3 IMPLANT
SUT ETHILON 2 0 FS 18 (SUTURE) ×6 IMPLANT
SUT MNCRL AB 4-0 PS2 18 (SUTURE) ×3 IMPLANT
SUT PDS AB 1 TP1 96 (SUTURE) ×6 IMPLANT
SUT PDS AB 3-0 SH 27 (SUTURE) ×12 IMPLANT
SUT SILK 2 0 SH CR/8 (SUTURE) ×3 IMPLANT
SUT SILK 2 0 TIES 10X30 (SUTURE) ×3 IMPLANT
SUT SILK 2 0SH CR/8 30 (SUTURE) ×3 IMPLANT
SUT SILK 3 0 SH CR/8 (SUTURE) ×6 IMPLANT
SUT SILK 3 0 TIES 10X30 (SUTURE) ×3 IMPLANT
SYR 5ML LUER SLIP (SYRINGE) ×3 IMPLANT
SYRINGE TOOMEY DISP (SYRINGE) ×3 IMPLANT
TOWEL GREEN STERILE (TOWEL DISPOSABLE) ×3 IMPLANT
TOWEL GREEN STERILE FF (TOWEL DISPOSABLE) ×3 IMPLANT
TRAY FOLEY MTR SLVR 14FR STAT (SET/KITS/TRAYS/PACK) ×3 IMPLANT
TRAY FOLEY MTR SLVR 16FR STAT (SET/KITS/TRAYS/PACK) ×3 IMPLANT
TRAY LAPAROSCOPIC MC (CUSTOM PROCEDURE TRAY) ×3 IMPLANT
TROCAR XCEL BLUNT TIP 100MML (ENDOMECHANICALS) IMPLANT
TROCAR XCEL NON-BLD 11X100MML (ENDOMECHANICALS) IMPLANT
TROCAR XCEL NON-BLD 5MMX100MML (ENDOMECHANICALS) ×3 IMPLANT
TUNNELER SHEATH ON-Q 16GX12 DP (PAIN MANAGEMENT) IMPLANT
YANKAUER SUCT BULB TIP NO VENT (SUCTIONS) ×3 IMPLANT

## 2019-03-19 NOTE — Transfer of Care (Signed)
Immediate Anesthesia Transfer of Care Note  Patient: Evan Brown  Procedure(s) Performed: LAPAROSCOPY DIAGNOSTIC (N/A Abdomen) PARTIAL GASTRECTOMY (N/A Abdomen) INSERTION OF JEJUNOSTOMY TUBE (N/A Abdomen)  Patient Location: PACU  Anesthesia Type:General and Epidural  Level of Consciousness: awake, alert  and oriented  Airway & Oxygen Therapy: Patient Spontanous Breathing and Patient connected to face mask oxygen  Post-op Assessment: Report given to RN and Post -op Vital signs reviewed and stable  Post vital signs: Reviewed and stable  Last Vitals:  Vitals Value Taken Time  BP 122/74 03/19/19 1513  Temp    Pulse 75 03/19/19 1522  Resp 13 03/19/19 1522  SpO2 98 % 03/19/19 1522  Vitals shown include unvalidated device data.  Last Pain:  Vitals:   03/19/19 1024  TempSrc:   PainSc: 0-No pain         Complications: No apparent anesthesia complications

## 2019-03-19 NOTE — Progress Notes (Addendum)
Patient's epidural cath dislodged. This nurse paged Dr. Donne Hazel and made aware  Paged anesthesia in regards to cath dislodgement. On their way up to unit

## 2019-03-19 NOTE — Op Note (Signed)
PRE-OPERATIVE DIAGNOSIS: gastric cancer, cT1-3N1  POST-OPERATIVE DIAGNOSIS:  Same  PROCEDURE:  Procedure(s): Diagnostic laparoscopy, (open) subtotal gastrectomy with Billroth II reconstruction and feeding jejunostomy tube  SURGEON:  Surgeon(s): [name], MD  ASSISTANT:   Verita Lamb, MD Christen Bame, RNFA  ANESTHESIA:   local and general  DRAINS: 16 Fr jejunostomy tube left mid abdomen  LOCAL MEDICATIONS USED:  BUPIVICAINE  and LIDOCAINE   SPECIMEN:  Source of Specimen:  distal portion of stomach (body and antrum) with omentum  DISPOSITION OF SPECIMEN:  PATHOLOGY  COUNTS:  YES  DICTATION: .Dragon Dictation  PLAN OF CARE: Admit to inpatient   PATIENT DISPOSITION:  PACU - hemodynamically stable.  FINDINGS:  No evidence of carcinomatosis, mass along the lesser curve approximately 1/3 of the distance between the pylorus and the GE jxn EBL: 100 mL  PROCEDURE:    The patient was identified in the holding area and was taken to the OR where he was placed supine on the operating room table.  General anesthesia was induced.  The left arm was tucked, and foley catheter was placed.  The abdomen was prepped and draped in sterile fashion.  Timeout was performed according to the surgical safety checklist.  When all was correct, we continued.    The patient was rotated to the left and placed into reverse trendelenburg position.  A 5 mm incision was made at the costal margin and a 5 mm Optiview trocar was placed under direct visualization.  Pneumoperitoneum was achieved to a pressure of 15 mm Hg.  A second 5 mm trocar was placed in the midline under direct visualization.  The abdomen was examined with the camera and no evidence of carcinomatosis was seen.  An upper midline incision was made in the abdomen and the abdomen was then manually explored.  The stomach was mobile, and the pelvis and undersurface of the liver was palpated.  Again, no carcinomatosis was identified.    The Bookwalter  was used to assist with visualization.  The stomach was mobilized.  The omentum was taken off the colon and the lesser sac was entered. The bovie and the harmonic scalpel were used to take the colon off the stomach.   The Harmonic was used to take down the gastroepiploics and the edge of the lesser curve and greater curve were exposed.  The adhesions of the omentum to the gallbladder were taken down.  The pylorus was identified and mobilized.  The GIA 75 was used to divide the pylorus.  A 3-0 PDS suture was used to oversew the staple line.  The stomach was then mobilized superiorly.  The left gastric was divided with a vascular load of the endoGIA stapler.    The tumor was identified and the stomach was divided with the GIA 75 around 6 cm proximal to the tumor.  Several  blue loads of the GIA 75 mm stapler were used to divide the stomach. The harmonic was then used to dissect superiorly and inferiorly.    The specimen was marked and sent to pathology. Frozen section returned negative.   The gastrojejunostomy was then performed.  This was antecolic and retrogastric.  The jejunum was secured to the posterior wall of the stomach with two 3-0 silk sutures.  The stomach and jejunum were opened, and the 75 mm stapler was used to create a stapled linear anastamosis. The NGT was advanced into the efferent limb of the jejunum, and the defect was closed with two 3-0 PDS sutures in connell  fashion.  The NGT was secured.  Additional silk sutures were used as anti-tension sutures.    A 3-0 silk pursestring suture was placed in the small intestine on the antimesenteric border around 30 cm beyond the gastrojejunostomy.  An appropriate location for a J-tube wass located in the left upper abdomen. A incision was made in the tonsil used to pull the J-tube through the abdominal wall. The jejunum was opened and the tube was threaded distally in the jejunum.    Witzel sutures were placed proximally on the jejunum.  The tube was  then pexed to the abdominal wall with 2-0 silk sutures.  The tube was flushed with saline.  The abdomen was then irrigated.  A sponge count was performed.  The fascia was then closed with two #1 looped PDS sutures.  The skin was irrigated and closed with staples.  The abdomen was then cleaned, dried, and dressed with dry sterile dressings.    Needle, sponge, and instrument counts were correct x 2.  The patient was allowed to emerge from anesthesia and taken to the PACU in stable condition.

## 2019-03-19 NOTE — Anesthesia Procedure Notes (Addendum)
Procedure Name: Intubation Date/Time: 03/19/2019 11:45 AM Performed by: Candis Shine, CRNA Pre-anesthesia Checklist: Patient identified, Emergency Drugs available, Suction available and Patient being monitored Patient Re-evaluated:Patient Re-evaluated prior to induction Oxygen Delivery Method: Circle System Utilized Preoxygenation: Pre-oxygenation with 100% oxygen Induction Type: IV induction Ventilation: Mask ventilation without difficulty and Oral airway inserted - appropriate to patient size Laryngoscope Size: Mac and 4 Grade View: Grade I Tube type: Oral Tube size: 7.5 mm Number of attempts: 1 Airway Equipment and Method: Stylet and Oral airway Placement Confirmation: ETT inserted through vocal cords under direct vision,  positive ETCO2 and breath sounds checked- equal and bilateral Secured at: 24 cm Tube secured with: Tape Dental Injury: Teeth and Oropharynx as per pre-operative assessment

## 2019-03-19 NOTE — Interval H&P Note (Signed)
History and Physical Interval Note:  03/19/2019 10:44 AM  Evan Brown  has presented today for surgery, with the diagnosis of GASTRIC CANCER.  The various methods of treatment have been discussed with the patient and family. After consideration of risks, benefits and other options for treatment, the patient has consented to  Procedure(s): LAPAROSCOPY DIAGNOSTIC (N/A) PARTIAL GASTRECTOMY (N/A) INSERTION OF GASTROSTOMY TUBE (N/A) as a surgical intervention.  The patient's history has been reviewed, patient examined, no change in status, stable for surgery.  I have reviewed the patient's chart and labs.  Questions were answered to the patient's satisfaction.     [name]

## 2019-03-19 NOTE — Anesthesia Postprocedure Evaluation (Signed)
Anesthesia Post Note  Patient: Evan Brown  Procedure(s) Performed: LAPAROSCOPY DIAGNOSTIC (N/A Abdomen) PARTIAL GASTRECTOMY (N/A Abdomen) INSERTION OF JEJUNOSTOMY TUBE (N/A Abdomen)     Patient location during evaluation: PACU Anesthesia Type: General Level of consciousness: awake and alert Pain management: pain level controlled Vital Signs Assessment: post-procedure vital signs reviewed and stable Respiratory status: spontaneous breathing, nonlabored ventilation, respiratory function stable and patient connected to nasal cannula oxygen Cardiovascular status: blood pressure returned to baseline and stable Postop Assessment: no apparent nausea or vomiting Anesthetic complications: no    Last Vitals:  Vitals:   03/19/19 1558 03/19/19 1600  BP: (!) 105/59   Pulse: 71 70  Resp: 12 12  Temp:    SpO2: 99% 99%    Last Pain:  Vitals:   03/19/19 1513  TempSrc:   PainSc: Asleep                 Takirah Binford S

## 2019-03-19 NOTE — Anesthesia Procedure Notes (Signed)
Epidural Patient location during procedure: pre-op Start time: 03/19/2019 11:15 AM End time: 03/19/2019 11:25 AM  Staffing Anesthesiologist: Effie Berkshire, MD Performed: anesthesiologist   Preanesthetic Checklist Completed: patient identified, site marked, surgical consent, pre-op evaluation, timeout performed, IV checked, risks and benefits discussed and monitors and equipment checked  Epidural Patient position: sitting Prep: DuraPrep Patient monitoring: heart rate, continuous pulse ox and blood pressure Approach: midline Location: thoracic (1-12) Injection technique: LOR saline  Needle:  Needle type: Tuohy  Needle gauge: 17 G Needle length: 9 cm Needle insertion depth: 5 cm Catheter type: closed end flexible Catheter size: 20 Guage Test dose: negative and 1.5% lidocaine  Assessment Sensory level: T7 Events: blood not aspirated, injection not painful, no injection resistance and no paresthesia  Additional Notes LOR @ 4.5  Patient identified. Risks/Benefits/Options discussed with patient including but not limited to bleeding, infection, nerve damage, paralysis, failed block, incomplete pain control, headache, blood pressure changes, nausea, vomiting, reactions to medications, itching and postpartum back pain. Confirmed with bedside nurse the patient's most recent platelet count. Confirmed with patient that they are not currently taking any anticoagulation, have any bleeding history or any family history of bleeding disorders. Patient expressed understanding and wished to proceed. All questions were answered. Sterile technique was used throughout the entire procedure. Please see nursing notes for vital signs. Test dose was given through epidural catheter and negative prior to continuing to dose epidural or start infusion. Warning signs of high block given to the patient including shortness of breath, tingling/numbness in hands, complete motor block, or any concerning symptoms with  instructions to call for help. Patient was given instructions on fall risk and not to get out of bed. All questions and concerns addressed with instructions to call with any issues or inadequate analgesia.    Reason for block:at surgeon's request and post-op pain management

## 2019-03-19 NOTE — Anesthesia Procedure Notes (Signed)
Arterial Line Insertion Start/End12/04/2018 10:55 AM, 03/19/2019 11:05 AM Performed by: Effie Berkshire, MD, anesthesiologist  Patient location: Pre-op. Preanesthetic checklist: patient identified, IV checked, site marked, risks and benefits discussed, surgical consent, monitors and equipment checked, pre-op evaluation, timeout performed and anesthesia consent Lidocaine 1% used for infiltration Left, radial was placed Catheter size: 20 Fr Hand hygiene performed  and maximum sterile barriers used   Attempts: 1 Procedure performed without using ultrasound guided technique. Following insertion, dressing applied and Biopatch. Post procedure assessment: normal and unchanged  Patient tolerated the procedure well with no immediate complications.

## 2019-03-19 NOTE — Anesthesia Post-op Follow-up Note (Signed)
  Anesthesia Pain Follow-up Note  Patient: Evan Brown  Day #:0  Date of Follow-up: 03/19/2019 Time: 10:23 PM  Last Vitals:  Vitals:   03/19/19 2034 03/19/19 2123  BP:  (!) 145/87  Pulse:  77  Resp: 15 18  Temp:  37.7 C  SpO2: 100% 100%    Level of Consciousness: alert  Pain: mild   Side Effects:None  Catheter Site Exam:clean, dry  Epidural / Intrathecal (From admission, onward)   Start     Dose/Rate Route Frequency Ordered Stop   03/19/19 1430  ropivacaine (PF) 2 mg/mL (0.2%) (NAROPIN) injection     10 mL/hr 10 mL/hr  Epidural Continuous 03/19/19 1417         Plan: Catheter is disconnected from alligator connection to infusion tubing. Exposed tip is buried underneath patient. Since catheter is no longer a sterile conduit, Catheter removed/tip intact. Pt has PCA narcotic available for analgesia tonight. Discussed with patient and RN, will alert Dr. Donne Hazel.  Midge Minium

## 2019-03-20 ENCOUNTER — Encounter (HOSPITAL_COMMUNITY): Payer: Self-pay | Admitting: General Surgery

## 2019-03-20 DIAGNOSIS — E440 Moderate protein-calorie malnutrition: Secondary | ICD-10-CM | POA: Insufficient documentation

## 2019-03-20 LAB — GLUCOSE, CAPILLARY: Glucose-Capillary: 118 mg/dL — ABNORMAL HIGH (ref 70–99)

## 2019-03-20 LAB — BASIC METABOLIC PANEL
Anion gap: 12 (ref 5–15)
BUN: 10 mg/dL (ref 6–20)
CO2: 24 mmol/L (ref 22–32)
Calcium: 9.1 mg/dL (ref 8.9–10.3)
Chloride: 102 mmol/L (ref 98–111)
Creatinine, Ser: 0.99 mg/dL (ref 0.61–1.24)
GFR calc Af Amer: >60 mL/min (ref >60)
GFR calc non Af Amer: >60 mL/min (ref >60)
Glucose, Bld: 120 mg/dL — ABNORMAL HIGH (ref 70–99)
Potassium: 4.3 mmol/L (ref 3.5–5.1)
Sodium: 138 mmol/L (ref 135–145)

## 2019-03-20 LAB — CBC
HCT: 43.3 % (ref 39.0–52.0)
Hemoglobin: 13.4 g/dL (ref 13.0–17.0)
MCH: 26.7 pg (ref 26.0–34.0)
MCHC: 30.9 g/dL (ref 30.0–36.0)
MCV: 86.3 fL (ref 80.0–100.0)
Platelets: 444 10*3/uL — ABNORMAL HIGH (ref 150–400)
RBC: 5.02 MIL/uL (ref 4.22–5.81)
RDW: 14.7 % (ref 11.5–15.5)
WBC: 21 10*3/uL — ABNORMAL HIGH (ref 4.0–10.5)
nRBC: 0 % (ref 0.0–0.2)

## 2019-03-20 LAB — PHOSPHORUS: Phosphorus: 3.1 mg/dL (ref 2.5–4.6)

## 2019-03-20 MED ORDER — OSMOLITE 1.5 CAL PO LIQD
1000.0000 mL | ORAL | Status: DC
  Administered 2019-03-21 – 2019-03-22 (×2): 1000 mL
  Filled 2019-03-20 (×2): qty 1000

## 2019-03-20 MED ORDER — CHLORHEXIDINE GLUCONATE CLOTH 2 % EX PADS
6.0000 | MEDICATED_PAD | Freq: Every day | CUTANEOUS | Status: DC
  Administered 2019-03-20 – 2019-03-22 (×3): 6 via TOPICAL

## 2019-03-20 MED ORDER — HYDRALAZINE HCL 20 MG/ML IJ SOLN
20.0000 mg | INTRAMUSCULAR | Status: DC | PRN
  Administered 2019-03-20: 20 mg via INTRAVENOUS
  Filled 2019-03-20: qty 1

## 2019-03-20 MED ORDER — PANTOPRAZOLE SODIUM 40 MG IV SOLR
40.0000 mg | Freq: Two times a day (BID) | INTRAVENOUS | Status: DC
  Administered 2019-03-20 – 2019-03-22 (×6): 40 mg via INTRAVENOUS
  Filled 2019-03-20 (×7): qty 40

## 2019-03-20 NOTE — Progress Notes (Signed)
This nurse paged Dr Donne Hazel on call in regards to patients elevated BP as well as new red drainage from patients NGT since 0500.

## 2019-03-20 NOTE — Progress Notes (Addendum)
Initial Nutrition Assessment  DOCUMENTATION CODES:   Non-severe (moderate) malnutrition in context of chronic illness  INTERVENTION:  Per MD, initiate Osmolite 1.5 @ 18mL/hr via j tube on 12/3  Goal rate of 73mL/hr with Prostat once daily  217mL free water flush four times daily  Initial TF regimen will provide 720 kcal, 30 grams of protein and 399mL free water, meeting 27% of estimated calorie needs and 25% of estimated protein needs.  The goal TF regimen will provide 2620 kcal, 120 grams of protein and 2050mL free water, meeting 100% of estimated calorie and protein needs.   NUTRITION DIAGNOSIS:   Moderate Malnutrition related to chronic illness(Gastric Cancer) as evidenced by mild fat depletion, moderate fat depletion, mild muscle depletion.  GOAL:   Patient will meet greater than or equal to 90% of their needs  MONITOR:   TF tolerance, Weight trends, Labs, Skin, I & O's  REASON FOR ASSESSMENT:   Consult Enteral/tube feeding initiation and management  ASSESSMENT:   The patient is a 53 year old male who presents with gastric cancer. Pt is a 53 yo M referred by Dr. Watt Climes for a new diagnosis of gastric cancer 12/2018.  He presented with abdominal pain in July 2020.  He continued to lose weight and had severe pain so returned to the ED 12/27/2018. Pt has no documented PMH, but reports prior alcohol use and currently smoking 4 cigarettes per day. Admitted for surgery scheduled for 12/1.  Pt s/p Diagnostic laparoscopy, (open) subtotal gastrectomy with Billroth II reconstruction and feeding jejunostomy tube on 12/1.  Distal portion of stomach (body and antrum) with omentum removed, sent to pathology with results pending.   Pt denies any nausea, vomiting, diarrhea or constipation, and  denies any abdominal pain other than some LUQ soreness from surgery. C/o dry mouth, was provided mouth swab in order to wet lips and tongue. Pt states, "I know I can't have anything to drink, I  understand that, my mouth is just so dry."  Bloody drainage was noted to be coming from NG tube.   Pt states that he began experiencing epigastric pain with emesis approximately 4 months ago and went to the ED at [facility] for evaluation where hernia and gastritis were identified, and upon reevaluation at [facility] long, was referred to gastroenterology where gastric cancer was diagnosed.   Pt reports good appetite prior to the onset of his epigastric pain. States that he typically eats two meals per day which consist of for breakfast: cheese toast, eggs, grits or oatmeal, and for dinner: spaghetti, chicken, salmon or "home cooking" of some sort. Admits to snacking throughout the day when he feels hungry which may consist of potato chips, nekots, tootsie rolls, smarties, or something of the like. Upon increasing abdominal discomfort, pt reports having a decreased desire to eat but states, "even when I was having pain, I still eat, I still eat." Denies use of any vitamin, protein or herbal supplements. Reports being energetic when not in the hospital and does not eat during the day because it makes him lethargic and he likes to have energy during the day in order to get things done. On visit today, patient reported being ready to get up and ambulate around the unit.   Pt endorses that his height is 6\' 3" , and that his UBW is 230-240#. States, "I know I've lost weight, I weigh 209 now and I've lost about 30 pounds." when asked about this recent wt loss, pt reported it occurred over  the past four months and that he notices the weight loss in his face and in his abdominal area, stating "I've had to go down a loop on my belt." Pt is -13% body weight in 4 months which is significant for the time frame.  Pt understands the importance of starting nutrition via TF and asked, Pt asked, "now will y'all come back and teach me how to use this tube before I go home?" RD explained that someone would come and instruct him  on how to use and care for his tube and that home health would likely also be consulted.  Pt had just moved into new apartment a few days prior to surgery, which is where he will return home to on discharge. Pt lives alone.   Pt was given opportunity to ask questions, to which he denied having any.  Labs reviewed include: sodium 138; chloride 102; glucose 120; creat 0.99; BUN 10 Medications reviewed include: dextrose 5%  NUTRITION - FOCUSED PHYSICAL EXAM:    Most Recent Value  Orbital Region  Mild depletion  Upper Arm Region  Moderate depletion  Thoracic and Lumbar Region  No depletion  Buccal Region  Mild depletion  Temple Region  Mild depletion  Clavicle Bone Region  Mild depletion  Clavicle and Acromion Bone Region  Mild depletion  Scapular Bone Region  No depletion  Dorsal Hand  Mild depletion  Patellar Region  Mild depletion  Anterior Thigh Region  Mild depletion  Posterior Calf Region  Mild depletion  Edema (RD Assessment)  None  Hair  Reviewed  Eyes  Reviewed  Mouth  Reviewed [poor dentition but pt denies any problems chewing or swallowing.]  Skin  Reviewed  Nails  Reviewed       Diet Order:   Diet Order            Diet NPO time specified  Diet effective now              EDUCATION NEEDS:   Education needs have been addressed  Skin:  Skin Assessment: Skin Integrity Issues: Skin Integrity Issues:: Incisions Incisions: abdomen  Last BM:  11/30  Height:   Ht Readings from Last 1 Encounters:  03/19/19 6\' 3"  (1.905 m)    Weight:   Wt Readings from Last 1 Encounters:  03/19/19 94.8 kg    Ideal Body Weight:  89.1 kg  BMI:  Body mass index is 26.12 kg/m.  Estimated Nutritional Needs:   Kcal:  2600-2800  Protein:  120-130 grams  Fluid:  >/= 2 L  Meda Klinefelter, Dietetic Intern

## 2019-03-20 NOTE — Progress Notes (Signed)
1 Day Post-Op   Subjective/Chief Complaint: Having some bloody output from his NGT.  No pain.  Epidural came out last night.     Objective: Vital signs in last 24 hours: Temp:  [97 F (36.1 C)-99.9 F (37.7 C)] 97.6 F (36.4 C) (12/02 1028) Pulse Rate:  [59-87] 73 (12/02 1028) Resp:  [10-20] 20 (12/02 1211) BP: (101-199)/(54-115) 143/82 (12/02 1028) SpO2:  [89 %-100 %] 94 % (12/02 1211) Arterial Line BP: (113-143)/(45-61) 131/55 (12/01 1645) Weight:  [94.8 kg] 94.8 kg (12/01 1706) Last BM Date: 03/18/19  Intake/Output from previous day: 12/01 0701 - 12/02 0700 In: 2845.5 [I.V.:2394.4; IV Piggyback:400.3] Out: 550 [Urine:360; Emesis/NG output:15; Drains:150; Blood:25] Intake/Output this shift: No intake/output data recorded.  General appearance: alert, cooperative and no distress Resp: breathing comfortably Cardio: regular rate and rhythm GI: soft, non distended, dressing c/d/i.  approp tender.  J tube in place. Extremities: extremities normal, atraumatic, no cyanosis or edema  Lab Results:  Recent Labs    03/20/19 0419  WBC 21.0*  HGB 13.4  HCT 43.3  PLT 444*   BMET Recent Labs    03/20/19 0419  NA 138  K 4.3  CL 102  CO2 24  GLUCOSE 120*  BUN 10  CREATININE 0.99  CALCIUM 9.1   PT/INR No results for input(s): LABPROT, INR in the last 72 hours. ABG No results for input(s): PHART, HCO3 in the last 72 hours.  Invalid input(s): PCO2, PO2  Studies/Results: No results found.  Anti-infectives: Anti-infectives (From admission, onward)   Start     Dose/Rate Route Frequency Ordered Stop   03/19/19 1930  ceFAZolin (ANCEF) IVPB 2g/100 mL premix     2 g 200 mL/hr over 30 Minutes Intravenous Every 8 hours 03/19/19 1716 03/20/19 0227   03/19/19 1016  ceFAZolin (ANCEF) 2-4 GM/100ML-% IVPB    Note to Pharmacy: Gustavo Lah   : cabinet override      03/19/19 1016 03/19/19 1146   03/19/19 1015  ceFAZolin (ANCEF) IVPB 2g/100 mL premix     2 g 200 mL/hr over 30  Minutes Intravenous On call to O.R. 03/19/19 1014 03/19/19 1146      Assessment/Plan: s/p Procedure(s): LAPAROSCOPY DIAGNOSTIC (N/A) PARTIAL GASTRECTOMY (N/A) INSERTION OF JEJUNOSTOMY TUBE (N/A) gastric cancer- await pathology  D/c foley tomorrow AM Start tube feeds tomorrow am. Continue NGT.   PRN meds for hypertension.    LOS: 1 day    Evan Brown 03/20/2019

## 2019-03-20 NOTE — Progress Notes (Addendum)
This RN spoke to [name], MD regarding pt's bloody drainage from NG tube. Per Barry Dienes, MD J tube to be flushed with 50cc sterile water TID. Will continue to monitor.

## 2019-03-20 NOTE — Plan of Care (Signed)
  Problem: Clinical Measurements: Goal: Ability to maintain clinical measurements within normal limits will improve Outcome: Progressing Goal: Postoperative complications will be avoided or minimized Outcome: Progressing   Problem: Skin Integrity: Goal: Demonstration of wound healing without infection will improve Outcome: Progressing   Problem: Education: Goal: Knowledge of General Education information will improve Description: Including pain rating scale, medication(s)/side effects and non-pharmacologic comfort measures Outcome: Progressing   Problem: Health Behavior/Discharge Planning: Goal: Ability to manage health-related needs will improve Outcome: Progressing   Problem: Pain Managment: Goal: General experience of comfort will improve Outcome: Progressing   Problem: Safety: Goal: Ability to remain free from injury will improve Outcome: Progressing   Problem: Skin Integrity: Goal: Risk for impaired skin integrity will decrease Outcome: Progressing

## 2019-03-20 NOTE — Progress Notes (Signed)
When flushing J tube with 50cc sterile water, bloody drainage came out of J tube and into syringe. [name], MD aware. Will continue to monitor.

## 2019-03-21 LAB — GLUCOSE, CAPILLARY
Glucose-Capillary: 110 mg/dL — ABNORMAL HIGH (ref 70–99)
Glucose-Capillary: 119 mg/dL — ABNORMAL HIGH (ref 70–99)
Glucose-Capillary: 119 mg/dL — ABNORMAL HIGH (ref 70–99)
Glucose-Capillary: 123 mg/dL — ABNORMAL HIGH (ref 70–99)
Glucose-Capillary: 131 mg/dL — ABNORMAL HIGH (ref 70–99)
Glucose-Capillary: 134 mg/dL — ABNORMAL HIGH (ref 70–99)
Glucose-Capillary: 137 mg/dL — ABNORMAL HIGH (ref 70–99)

## 2019-03-21 LAB — BASIC METABOLIC PANEL
Anion gap: 8 (ref 5–15)
BUN: 11 mg/dL (ref 6–20)
CO2: 27 mmol/L (ref 22–32)
Calcium: 8.6 mg/dL — ABNORMAL LOW (ref 8.9–10.3)
Chloride: 101 mmol/L (ref 98–111)
Creatinine, Ser: 0.91 mg/dL (ref 0.61–1.24)
GFR calc Af Amer: >60 mL/min (ref >60)
GFR calc non Af Amer: >60 mL/min (ref >60)
Glucose, Bld: 133 mg/dL — ABNORMAL HIGH (ref 70–99)
Potassium: 4.4 mmol/L (ref 3.5–5.1)
Sodium: 136 mmol/L (ref 135–145)

## 2019-03-21 LAB — SURGICAL PATHOLOGY

## 2019-03-21 LAB — CBC
HCT: 33.7 % — ABNORMAL LOW (ref 39.0–52.0)
Hemoglobin: 10.7 g/dL — ABNORMAL LOW (ref 13.0–17.0)
MCH: 27 pg (ref 26.0–34.0)
MCHC: 31.8 g/dL (ref 30.0–36.0)
MCV: 85.1 fL (ref 80.0–100.0)
Platelets: 363 10*3/uL (ref 150–400)
RBC: 3.96 MIL/uL — ABNORMAL LOW (ref 4.22–5.81)
RDW: 14.9 % (ref 11.5–15.5)
WBC: 18.9 10*3/uL — ABNORMAL HIGH (ref 4.0–10.5)
nRBC: 0 % (ref 0.0–0.2)

## 2019-03-21 MED ORDER — ACETAMINOPHEN 325 MG PO TABS
650.0000 mg | ORAL_TABLET | Freq: Four times a day (QID) | ORAL | Status: DC | PRN
  Administered 2019-03-21 – 2019-03-24 (×3): 650 mg via ORAL
  Filled 2019-03-21 (×3): qty 2

## 2019-03-21 MED ORDER — OSMOLITE 1.5 CAL PO LIQD
1000.0000 mL | ORAL | Status: DC
  Filled 2019-03-21 (×2): qty 1000

## 2019-03-21 MED ORDER — ENOXAPARIN SODIUM 40 MG/0.4ML ~~LOC~~ SOLN
40.0000 mg | Freq: Every day | SUBCUTANEOUS | Status: DC
  Administered 2019-03-21 – 2019-03-26 (×6): 40 mg via SUBCUTANEOUS
  Filled 2019-03-21 (×6): qty 0.4

## 2019-03-21 NOTE — Progress Notes (Signed)
Patient's foley was leaking. Orders for foley removal in the morning. This nurse removed foley and will keep an eye on UO for the next 6 hours.

## 2019-03-21 NOTE — Plan of Care (Signed)
  Problem: Clinical Measurements: Goal: Ability to maintain clinical measurements within normal limits will improve Outcome: Progressing Goal: Postoperative complications will be avoided or minimized Outcome: Progressing   

## 2019-03-21 NOTE — Plan of Care (Signed)
  Problem: Clinical Measurements: Goal: Ability to maintain clinical measurements within normal limits will improve Outcome: Progressing   Problem: Education: Goal: Knowledge of General Education information will improve Description: Including pain rating scale, medication(s)/side effects and non-pharmacologic comfort measures Outcome: Progressing   Problem: Health Behavior/Discharge Planning: Goal: Ability to manage health-related needs will improve Outcome: Progressing   Problem: Clinical Measurements: Goal: Ability to maintain clinical measurements within normal limits will improve Outcome: Progressing Goal: Respiratory complications will improve Outcome: Progressing Goal: Cardiovascular complication will be avoided Outcome: Progressing   Problem: Activity: Goal: Risk for activity intolerance will decrease Outcome: Progressing   Problem: Coping: Goal: Level of anxiety will decrease Outcome: Progressing   Problem: Elimination: Goal: Will not experience complications related to urinary retention Outcome: Progressing   Problem: Pain Managment: Goal: General experience of comfort will improve Outcome: Progressing   Problem: Safety: Goal: Ability to remain free from injury will improve Outcome: Progressing   Problem: Skin Integrity: Goal: Risk for impaired skin integrity will decrease Outcome: Progressing

## 2019-03-21 NOTE — Progress Notes (Signed)
NG tube removed as ordered. Pt tolerated well. Will continue to monitor.

## 2019-03-21 NOTE — Progress Notes (Signed)
2 Days Post-Op   Subjective/Chief Complaint: Denies pain.  No n/v.  No flatus yet.     Objective: Vital signs in last 24 hours: Temp:  [97.6 F (36.4 C)-99 F (37.2 C)] 98.9 F (37.2 C) (12/03 0513) Pulse Rate:  [71-92] 92 (12/03 0513) Resp:  [17-20] 20 (12/03 0641) BP: (136-150)/(80-90) 150/84 (12/03 0513) SpO2:  [91 %-97 %] 91 % (12/03 0641) Weight:  [96.3 kg] 96.3 kg (12/03 0500) Last BM Date: 03/18/19  Intake/Output from previous day: 12/02 0701 - 12/03 0700 In: 120  Out: 1750 [Urine:1700; Emesis/NG output:50] Intake/Output this shift: No intake/output data recorded.  General appearance: alert, cooperative and no distress Resp: breathing comfortably Cardio: regular rate and rhythm GI: soft, non distended, dressing c/d/i.  approp tender.  J tube in place. Extremities: extremities normal, atraumatic, no cyanosis or edema  Lab Results:  Recent Labs    03/20/19 0419  WBC 21.0*  HGB 13.4  HCT 43.3  PLT 444*   BMET Recent Labs    03/20/19 0419  NA 138  K 4.3  CL 102  CO2 24  GLUCOSE 120*  BUN 10  CREATININE 0.99  CALCIUM 9.1   PT/INR No results for input(s): LABPROT, INR in the last 72 hours. ABG No results for input(s): PHART, HCO3 in the last 72 hours.  Invalid input(s): PCO2, PO2  Studies/Results: No results found.  Anti-infectives: Anti-infectives (From admission, onward)   Start     Dose/Rate Route Frequency Ordered Stop   03/19/19 1930  ceFAZolin (ANCEF) IVPB 2g/100 mL premix     2 g 200 mL/hr over 30 Minutes Intravenous Every 8 hours 03/19/19 1716 03/20/19 0227   03/19/19 1016  ceFAZolin (ANCEF) 2-4 GM/100ML-% IVPB    Note to Pharmacy: Gustavo Lah   : cabinet override      03/19/19 1016 03/19/19 1146   03/19/19 1015  ceFAZolin (ANCEF) IVPB 2g/100 mL premix     2 g 200 mL/hr over 30 Minutes Intravenous On call to O.R. 03/19/19 1014 03/19/19 1146      Assessment/Plan: s/p Procedure(s): LAPAROSCOPY DIAGNOSTIC (N/A) PARTIAL  GASTRECTOMY (N/A) INSERTION OF JEJUNOSTOMY TUBE (N/A) gastric cancer- await pathology  Foley out. D/c NGT.  Sips/ice chips ok today. Tube feeds to start at trophic levels today.   PRN meds for hypertension.     LOS: 2 days    [name] 03/21/2019

## 2019-03-22 LAB — PHOSPHORUS: Phosphorus: 2.2 mg/dL — ABNORMAL LOW (ref 2.5–4.6)

## 2019-03-22 LAB — CBC
HCT: 30.6 % — ABNORMAL LOW (ref 39.0–52.0)
Hemoglobin: 9.8 g/dL — ABNORMAL LOW (ref 13.0–17.0)
MCH: 27.2 pg (ref 26.0–34.0)
MCHC: 32 g/dL (ref 30.0–36.0)
MCV: 85 fL (ref 80.0–100.0)
Platelets: 310 10*3/uL (ref 150–400)
RBC: 3.6 MIL/uL — ABNORMAL LOW (ref 4.22–5.81)
RDW: 14.9 % (ref 11.5–15.5)
WBC: 17.3 10*3/uL — ABNORMAL HIGH (ref 4.0–10.5)
nRBC: 0 % (ref 0.0–0.2)

## 2019-03-22 LAB — GLUCOSE, CAPILLARY
Glucose-Capillary: 107 mg/dL — ABNORMAL HIGH (ref 70–99)
Glucose-Capillary: 108 mg/dL — ABNORMAL HIGH (ref 70–99)
Glucose-Capillary: 111 mg/dL — ABNORMAL HIGH (ref 70–99)
Glucose-Capillary: 118 mg/dL — ABNORMAL HIGH (ref 70–99)
Glucose-Capillary: 143 mg/dL — ABNORMAL HIGH (ref 70–99)

## 2019-03-22 LAB — BASIC METABOLIC PANEL
Anion gap: 8 (ref 5–15)
BUN: 13 mg/dL (ref 6–20)
CO2: 24 mmol/L (ref 22–32)
Calcium: 8.6 mg/dL — ABNORMAL LOW (ref 8.9–10.3)
Chloride: 105 mmol/L (ref 98–111)
Creatinine, Ser: 0.83 mg/dL (ref 0.61–1.24)
GFR calc Af Amer: >60 mL/min (ref >60)
GFR calc non Af Amer: >60 mL/min (ref >60)
Glucose, Bld: 97 mg/dL (ref 70–99)
Potassium: 4 mmol/L (ref 3.5–5.1)
Sodium: 137 mmol/L (ref 135–145)

## 2019-03-22 LAB — MAGNESIUM: Magnesium: 2.1 mg/dL (ref 1.7–2.4)

## 2019-03-22 MED ORDER — OSMOLITE 1.5 CAL PO LIQD
1000.0000 mL | ORAL | Status: DC
  Administered 2019-03-22: 1000 mL
  Filled 2019-03-22 (×2): qty 1000

## 2019-03-22 MED ORDER — BOOST / RESOURCE BREEZE PO LIQD CUSTOM
1.0000 | Freq: Three times a day (TID) | ORAL | Status: DC
  Administered 2019-03-22 – 2019-03-25 (×8): 1 via ORAL

## 2019-03-22 MED ORDER — HYDROMORPHONE HCL 1 MG/ML IJ SOLN
0.5000 mg | INTRAMUSCULAR | Status: DC | PRN
  Administered 2019-03-22: 2 mg via INTRAVENOUS
  Filled 2019-03-22 (×2): qty 2

## 2019-03-22 NOTE — Plan of Care (Signed)
  Problem: Clinical Measurements: Goal: Ability to maintain clinical measurements within normal limits will improve Outcome: Progressing Goal: Postoperative complications will be avoided or minimized Outcome: Progressing   

## 2019-03-22 NOTE — Progress Notes (Signed)
3 Days Post-Op   Subjective/Chief Complaint: No pain.  No n/v.  No flatus yet.  Tolerated trophic feeds.    Objective: Vital signs in last 24 hours: Temp:  [98.2 F (36.8 C)-100.9 F (38.3 C)] 99.1 F (37.3 C) (12/04 0602) Pulse Rate:  [89-110] 89 (12/04 0602) Resp:  [16-23] 18 (12/04 0602) BP: (139-157)/(85-99) 153/92 (12/04 0602) SpO2:  [92 %-100 %] 97 % (12/04 0602) Last BM Date: 03/18/19  Intake/Output from previous day: 12/03 0701 - 12/04 0700 In: 3416 [I.V.:3176; NG/GT:240] Out: 2900 [Urine:2800; Emesis/NG output:100] Intake/Output this shift: No intake/output data recorded.  General appearance: sleeping, but arouseable Resp: breathing comfortably Cardio: regular rate and rhythm GI: soft, non distended, dressing c/d/i.  approp tender.  J tube in place. Extremities: extremities normal, atraumatic, no cyanosis or edema  Lab Results:  Recent Labs    03/21/19 0728 03/22/19 0110  WBC 18.9* 17.3*  HGB 10.7* 9.8*  HCT 33.7* 30.6*  PLT 363 310   BMET Recent Labs    03/21/19 0728 03/22/19 0110  NA 136 137  K 4.4 4.0  CL 101 105  CO2 27 24  GLUCOSE 133* 97  BUN 11 13  CREATININE 0.91 0.83  CALCIUM 8.6* 8.6*   PT/INR No results for input(s): LABPROT, INR in the last 72 hours. ABG No results for input(s): PHART, HCO3 in the last 72 hours.  Invalid input(s): PCO2, PO2  Studies/Results: No results found.  Anti-infectives: Anti-infectives (From admission, onward)   Start     Dose/Rate Route Frequency Ordered Stop   03/19/19 1930  ceFAZolin (ANCEF) IVPB 2g/100 mL premix     2 g 200 mL/hr over 30 Minutes Intravenous Every 8 hours 03/19/19 1716 03/20/19 0227   03/19/19 1016  ceFAZolin (ANCEF) 2-4 GM/100ML-% IVPB    Note to Pharmacy: Gustavo Lah   : cabinet override      03/19/19 1016 03/19/19 1146   03/19/19 1015  ceFAZolin (ANCEF) IVPB 2g/100 mL premix     2 g 200 mL/hr over 30 Minutes Intravenous On call to O.R. 03/19/19 1014 03/19/19 1146       Assessment/Plan: s/p Procedure(s): LAPAROSCOPY DIAGNOSTIC (N/A) PARTIAL GASTRECTOMY (N/A) INSERTION OF JEJUNOSTOMY TUBE (N/A) gastric cancer-  pT1bN0!!! Clears as tolerated today.  Increase tube feeds just a bit today.  Once patient having flatus, can increase more.   PRN meds for hypertension.     LOS: 3 days    Evan Brown 03/22/2019

## 2019-03-22 NOTE — Progress Notes (Addendum)
Nutrition Follow-up  DOCUMENTATION CODES:   Non-severe (moderate) malnutrition in context of chronic illness  INTERVENTION:  Continue Osmolite 1.5 @ 75mL/hr via J tube- MD plans to advance once pt begins passing flatus  Goal rate of 69mL/hr with Prostat once daily  278mL free water flush four times daily  Current TF regimen will provide 1260 kcal, 53 grams of protein and 679mL free water, meeting 48% of estimated calorie needs and 44% of estimated protein needs.  The goal TF regimen will provide 2620 kcal, 120 grams of protein and 2039mL free water, meeting 100% of estimated calorie and protein needs.   Boost Breeze BID (each supplement provides 250 calories and 9 grams of protein)  NUTRITION DIAGNOSIS:   Moderate Malnutrition related to chronic illness(Gastric Cancer) as evidenced by mild fat depletion, moderate fat depletion, mild muscle depletion. Ongoing  GOAL:   Patient will meet greater than or equal to 90% of their needs; progressing  MONITOR:   TF tolerance, Weight trends, Labs, Skin, I & O's  REASON FOR ASSESSMENT:   Consult Enteral/tube feeding initiation and management  ASSESSMENT:   The patient is a 53 year old male who presents with gastric cancer. Pt is a 53 yo M referred by Dr. Watt Climes for a new diagnosis of gastric cancer 12/2018.  He presented with abdominal pain in July 2020.  He continued to lose weight and had severe pain so returned to the ED 12/27/2018. Pt has no documented PMH, but reports prior alcohol use and currently smoking 4 cigarettes per day. Admitted for surgery scheduled for 12/1.  Pt was up and sleeping in the chair during visit. Stated that he got himself there without assistance. Pt was tired, but in good spirits. RD woke him up so tiredness expected.   Pt denies n/v with tube feeding, MD increased rate from 58mL/hr to 2mL per hour and advanced diet to clear liquids. Pt had not yet had anything to eat or drink this morning and stated that  he would try later after he woke again. Asked what he could have on a clear diet and was educated on what is available for patients on a clear liquid diet. Pt was also educated on the [employer] supplement that is permissible on the clear liquids diet and is interested in peach and orange flavors. RD brought boost breeze to pt room for pt to try and he stated he would try when he woke up.  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  Pt denies passing flatus and per MD note, TF rate will increase once pt begins passing gas.   Pt continues to be weight stable.  Labs and mediations reviewed.  Diet Order:   Diet Order            Diet clear liquid Room service appropriate? Yes; Fluid consistency: Thin  Diet effective now              EDUCATION NEEDS:   Education needs have been addressed  Skin:  Skin Assessment: Skin Integrity Issues: Skin Integrity Issues:: Incisions Incisions: abdomen  Last BM:  11/30  Height:   Ht Readings from Last 1 Encounters:  03/19/19 6\' 3"  (1.905 m)    Weight:   Wt Readings from Last 1 Encounters:  03/21/19 96.3 kg    Ideal Body Weight:  89.1 kg  BMI:  Body mass index is 26.54 kg/m.  Estimated Nutritional Needs:   Kcal:  2600-2800  Protein:  120-130 grams  Fluid:  >/= 2 L  Meda Klinefelter, Dietetic Intern

## 2019-03-23 LAB — BASIC METABOLIC PANEL
Anion gap: 9 (ref 5–15)
BUN: 9 mg/dL (ref 6–20)
CO2: 23 mmol/L (ref 22–32)
Calcium: 8.8 mg/dL — ABNORMAL LOW (ref 8.9–10.3)
Chloride: 107 mmol/L (ref 98–111)
Creatinine, Ser: 0.79 mg/dL (ref 0.61–1.24)
GFR calc Af Amer: >60 mL/min (ref >60)
GFR calc non Af Amer: >60 mL/min (ref >60)
Glucose, Bld: 139 mg/dL — ABNORMAL HIGH (ref 70–99)
Potassium: 3.8 mmol/L (ref 3.5–5.1)
Sodium: 139 mmol/L (ref 135–145)

## 2019-03-23 LAB — CBC
HCT: 32.2 % — ABNORMAL LOW (ref 39.0–52.0)
Hemoglobin: 10.3 g/dL — ABNORMAL LOW (ref 13.0–17.0)
MCH: 27 pg (ref 26.0–34.0)
MCHC: 32 g/dL (ref 30.0–36.0)
MCV: 84.5 fL (ref 80.0–100.0)
Platelets: 362 10*3/uL (ref 150–400)
RBC: 3.81 MIL/uL — ABNORMAL LOW (ref 4.22–5.81)
RDW: 15 % (ref 11.5–15.5)
WBC: 16.9 10*3/uL — ABNORMAL HIGH (ref 4.0–10.5)
nRBC: 0 % (ref 0.0–0.2)

## 2019-03-23 LAB — GLUCOSE, CAPILLARY
Glucose-Capillary: 123 mg/dL — ABNORMAL HIGH (ref 70–99)
Glucose-Capillary: 117 mg/dL — ABNORMAL HIGH (ref 70–99)
Glucose-Capillary: 131 mg/dL — ABNORMAL HIGH (ref 70–99)
Glucose-Capillary: 126 mg/dL — ABNORMAL HIGH (ref 70–99)
Glucose-Capillary: 118 mg/dL — ABNORMAL HIGH (ref 70–99)

## 2019-03-23 MED ORDER — HYDROCORTISONE 1 % EX CREA
1.0000 "application " | TOPICAL_CREAM | Freq: Three times a day (TID) | CUTANEOUS | Status: DC | PRN
  Filled 2019-03-23: qty 28

## 2019-03-23 MED ORDER — ALUM & MAG HYDROXIDE-SIMETH 200-200-20 MG/5ML PO SUSP: 30.0000 mL | Freq: Four times a day (QID) | ORAL | Status: DC | PRN

## 2019-03-23 MED ORDER — OSMOLITE 1.5 CAL PO LIQD
1000.0000 mL | ORAL | Status: DC
  Administered 2019-03-23: 1000 mL
  Filled 2019-03-23 (×2): qty 1000

## 2019-03-23 MED ORDER — OMEPRAZOLE 2 MG/ML ORAL SUSPENSION
40.0000 mg | Freq: Every day | ORAL | Status: DC
  Filled 2019-03-23: qty 20

## 2019-03-23 MED ORDER — HYDROCORTISONE (PERIANAL) 2.5 % EX CREA
1.0000 "application " | TOPICAL_CREAM | Freq: Four times a day (QID) | CUTANEOUS | Status: DC | PRN
  Filled 2019-03-23: qty 28.35

## 2019-03-23 MED ORDER — LIP MEDEX EX OINT
1.0000 "application " | TOPICAL_OINTMENT | Freq: Two times a day (BID) | CUTANEOUS | Status: DC
  Administered 2019-03-24 – 2019-03-25 (×3): 1 via TOPICAL
  Filled 2019-03-23: qty 7

## 2019-03-23 MED ORDER — SODIUM CHLORIDE 0.9 % IV SOLN: 250.0000 mL | INTRAVENOUS | Status: DC | PRN

## 2019-03-23 MED ORDER — MAGIC MOUTHWASH
15.0000 mL | Freq: Four times a day (QID) | ORAL | Status: DC | PRN
  Filled 2019-03-23: qty 15

## 2019-03-23 MED ORDER — LACTATED RINGERS IV BOLUS: 1000.0000 mL | Freq: Three times a day (TID) | INTRAVENOUS | Status: AC | PRN

## 2019-03-23 MED ORDER — ACETAMINOPHEN 650 MG RE SUPP: 650.0000 mg | Freq: Four times a day (QID) | RECTAL | Status: DC | PRN

## 2019-03-23 MED ORDER — PANTOPRAZOLE SODIUM 40 MG PO PACK
40.0000 mg | PACK | Freq: Every day | ORAL | Status: DC
  Administered 2019-03-23 – 2019-03-26 (×4): 40 mg
  Filled 2019-03-23 (×5): qty 20

## 2019-03-23 MED ORDER — PHENOL 1.4 % MT LIQD: 1.0000 | OROMUCOSAL | Status: DC | PRN

## 2019-03-23 MED ORDER — MENTHOL 3 MG MT LOZG: 1.0000 | LOZENGE | OROMUCOSAL | Status: DC | PRN

## 2019-03-23 MED ORDER — SODIUM CHLORIDE 0.9% FLUSH: 3.0000 mL | INTRAVENOUS | Status: DC | PRN

## 2019-03-23 MED ORDER — METOCLOPRAMIDE HCL 5 MG/ML IJ SOLN: 5.0000 mg | Freq: Three times a day (TID) | INTRAMUSCULAR | Status: DC | PRN

## 2019-03-23 MED ORDER — GUAIFENESIN-DM 100-10 MG/5ML PO SYRP: 10.0000 mL | ORAL_SOLUTION | ORAL | Status: DC | PRN

## 2019-03-23 MED ORDER — SODIUM CHLORIDE 0.9% FLUSH
3.0000 mL | Freq: Two times a day (BID) | INTRAVENOUS | Status: DC
  Administered 2019-03-23 – 2019-03-25 (×3): 3 mL via INTRAVENOUS

## 2019-03-23 NOTE — Progress Notes (Signed)
Evan Brown [id number] [birth year]  CARE TEAM:  PCP: Patient, No Pcp Per  Outpatient Care Team: Patient Care Team: Patient, No Pcp Per as PCP - General (General Practice)  Inpatient Treatment Team: Treatment Team: Attending Provider: [name], MD; Registered Nurse: Candida Peeling, RN; Registered Nurse: Dorian Pod, RN; Technician: Merton Border, NT; Technician: Raenette Rover, NT   Problem List:   Active Problems:   Gastric cancer [facility])   Malnutrition of moderate degree   4 Days Post-Op  03/19/2019  OPERATIVE DIAGNOSIS: gastric cancer, cT1-3N1  POST-OPERATIVE DIAGNOSIS:  Same  PROCEDURE:  Diagnostic laparoscopy open subtotal gastrectomy with Billroth II reconstruction feeding jejunostomy tube  SURGEON:  [name], MD  SURGICAL PATHOLOGY  CASE: [phone]  PATIENT: Evan Brown  Surgical Pathology Report      Clinical History: None provided      FINAL MICROSCOPIC DIAGNOSIS:   A. 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  - Tumor is limited to submucosa, see comment.  - Resection margins are negative.  - Thirteen of thirteen lymph nodes negative for carcinoma (0/13).  - Benign omentum.  - See oncology table.   ONCOLOGY TABLE:   STOMACH:   Procedure: Partial gastrectomy and omentectomy.  Tumor Site: Distal stomach  Tumor Size: 1.8 cm  Histologic Type: Adenocarcinoma  Histologic Grade: G1, well differentiated  Tumor Extension: Limited to submucosa.  Margins: Uninvolved by tumor  Treatment Effect: No know presurgical therapy  Lymphovascular Invasion: Not identified  Regional Lymph Nodes:    Number of Lymph Nodes Involved: 0    Number of Lymph Nodes Examined: 13  Pathologic Stage Classification (pTNM, AJCC 8th Edition): pT1b, pN0  Ancillary Studies: [Can be performed upon request]  Representative Tumor Block: A4  Comment(s): In areas adjacent to tumor the  muscularis propria is  replaced by fibrosis, but the tumor appears to be limited to the  submucosa. Dr. Vic Ripper has reviewed select slides.   (v4.1.0.0)    INTRAOPERATIVE DIAGNOSIS:   MARGIN AT STITCH: NEGATIVE FOR CARCINOMA. DR. Vic Ripper    Glennie Bose DESCRIPTION:   Received fresh for rapid intraoperative consult is a partial gastrectomy  and a separate 26 x 16 x 1.5 cm portion of omentum. The gastrectomy is  previously partially open and there is a suture attached at the proximal  margin identifying the area to be evaluated at frozen section. The  portion of stomach measures 19 cm along the greater curvature and 8 cm  along the lesser curvature. There is a 2 cm cuff of duodenum present.  There is a 1.8 x 1.5 cm ulcer present along the lesser curvature. The  ulcer is located 5 cm from the proximal margin and 7 cm from the distal  margin. The cut surface at the ulcer shows solid white tissue  infiltrating the wall with a maximum thickness of 0.7 cm. Extension  beyond the wall is not grossly identified. The uninvolved mucosa is  glistening, tan with preservation of the rugal folds. There are 14  rubbery tan-pink ovoid nodules grossly consistent with lymph nodes  measuring 0.3 to 0.8 cm in greatest dimension.  Sections are submitted in 14 cassettes.  1, 2 = sections at proximal margin with suture, submitted for frozen  section  3 = distal margin  4-8 = entire ulcerated lesion  9 = representative section of stomach proximal to ulcer  10 = representative section of stomach distal to ulcer  11, 12, 13 =  lymph nodes  14 = sections of omentum. [facility] 03/20/2019)    Final Diagnosis performed by Vicente Males, MD.  Electronically signed  03/21/2019  Technical component performed at [facility]. [facility], [place] [address], [place], [place] [address].  Professional component performed at [facility],  [place] [address] [place], [place] [address].   Immunohistochemistry Technical component (if applicable) was performed  at [facility]. [address] [place],  [place], [place] [address].  IMMUNOHISTOCHEMISTRY DISCLAIMER (if applicable):  Some of these immunohistochemical stains may have been developed and the  performance characteristics determine by [facility]. Some  may not have been cleared or approved by the U.S. Food and Drug  Administration. The FDA has determined that such clearance or approval  is not necessary. This test is used for clinical purposes. It should not  be regarded as investigational or for research. This laboratory is  certified under the [place]  (CLIA-88) as qualified to perform high complexity clinical laboratory  testing. The controls stained appropriately.    Assessment  Recovering well so far  [facility] Stay = 4 days)  Plan:  -Advance tube feeds to goal of 70 mL an hour gradually.  If tolerates that and clears, can try to advance oral diet beyond clear liquids.  Pathology shows early cancer encouraging.  Dr. Owens Shark I have discussed with patient.  Pain and nausea control.  PPI with history of gastrectomy and bad heartburn reflux  -VTE prophylaxis- SCDs, etc -mobilize as tolerated to help recovery  20 minutes spent in review, evaluation, examination, counseling, and coordination of care.  More than 50% of that time was spent in counseling.  03/23/2019    Subjective: (Chief complaint)  No problems with 50% goal tube feeds  Feeling better overall.  Pain control.  Had bowel movement.  No definite flatus.  Tolerating clears without nausea or worsening crampiness.  Nursing in room.  Objective:  Vital signs:  Vitals:   03/22/19 1805 03/22/19 1955 03/23/19 0436 03/23/19 0436  BP: (!) 160/96 (!) 155/87 (!) 154/97 (!) 164/99  Pulse: 98 (!) 110 94 94  Resp: 20 20 18 18   Temp: 98.8 F (37.1 C) 98.2 F  (36.8 C) 99.6 F (37.6 C) 99.6 F (37.6 C)  TempSrc: Oral Oral Oral Oral  SpO2: 97% 95% 95% 95%  Weight:      Height:        Last BM Date: 03/22/19  Intake/Output   Yesterday:  12/04 0701 - 12/05 0700 In: 200 [P.O.:200] Out: 2300 [Urine:2300] This shift:  No intake/output data recorded.  Bowel function:  Flatus: No  BM:  YES  Drain: (No drain)   Physical Exam:  General: Pt awake/alert/oriented x4 in no acute distress Eyes: PERRL, normal EOM.  Sclera clear.  No icterus Neuro: CN II-XII intact w/o focal sensory/motor deficits. Lymph: No head/neck/groin lymphadenopathy Psych:  No delerium/psychosis/paranoia HENT: Normocephalic, Mucus membranes moist.  No thrush Neck: Supple, No tracheal deviation Chest: No chest wall pain w good excursion CV:  Pulses intact.  Regular rhythm MS: Normal AROM mjr joints.  No obvious deformity  Abdomen: Soft.  Mildy distended.  Mildly tender at incisions only.  Dressings clean dry intact.  Jejunostomy tube site clean.  Tube feeds at 35 mL an hour no evidence of peritonitis.  No incarcerated hernias.  Ext:   No deformity.  No mjr edema.  No cyanosis Skin: No petechiae / purpura  Results:  Cultures: Recent Results (from the past 720 hour(s))  SARS CORONAVIRUS 2 (TAT 6-24 HRS) Nasopharyngeal Nasopharyngeal Swab     Status: None   Collection Time: 03/15/19 11:08 AM   Specimen: Nasopharyngeal Swab  Result Value Ref Range Status   SARS Coronavirus 2 NEGATIVE NEGATIVE Final    Comment: (NOTE) SARS-CoV-2 target nucleic acids are NOT DETECTED. The SARS-CoV-2 RNA is generally detectable in upper and lower respiratory specimens during the acute phase of infection. Negative results do not preclude SARS-CoV-2 infection, do not rule out co-infections with other pathogens, and should not be used as the sole basis for treatment or other patient management decisions. Negative results must be combined with clinical observations, patient history,  and epidemiological information. The expected result is Negative. Fact Sheet for Patients: SugarRoll.be Fact Sheet for Healthcare Providers: https://www.woods-mathews.com/ This test is not yet approved or cleared by the [place] FDA and  has been authorized for detection and/or diagnosis of SARS-CoV-2 by FDA under an Emergency Use Authorization (EUA). This EUA will remain  in effect (meaning this test can be used) for the duration of the COVID-19 declaration under Section 56 4(b)(1) of the Act, 21 U.S.C. section 360bbb-3(b)(1), unless the authorization is terminated or revoked sooner. Performed at [facility], [place] [address] [place], [place] [address]     Labs: Results for orders placed or performed during the hospital encounter of 03/19/19 (from the past 48 hour(s))  Glucose, capillary     Status: Abnormal   Collection Time: 03/21/19 12:08 PM  Result Value Ref Range   Glucose-Capillary 134 (H) 70 - 99 mg/dL  Glucose, capillary     Status: Abnormal   Collection Time: 03/21/19  4:24 PM  Result Value Ref Range   Glucose-Capillary 131 (H) 70 - 99 mg/dL  Glucose, capillary     Status: Abnormal   Collection Time: 03/21/19  8:06 PM  Result Value Ref Range   Glucose-Capillary 119 (H) 70 - 99 mg/dL  Glucose, capillary     Status: Abnormal   Collection Time: 03/21/19 11:58 PM  Result Value Ref Range   Glucose-Capillary 110 (H) 70 - 99 mg/dL  CBC     Status: Abnormal   Collection Time: 03/22/19  1:10 AM  Result Value Ref Range   WBC 17.3 (H) 4.0 - 10.5 K/uL   RBC 3.60 (L) 4.22 - 5.81 MIL/uL   Hemoglobin 9.8 (L) 13.0 - 17.0 g/dL   HCT 30.6 (L) 39.0 - 52.0 %   MCV 85.0 80.0 - 100.0 fL   MCH 27.2 26.0 - 34.0 pg   MCHC 32.0 30.0 - 36.0 g/dL   RDW 14.9 11.5 - 15.5 %   Platelets 310 150 - 400 K/uL   nRBC 0.0 0.0 - 0.2 %    Comment: Performed at [facility], [place] [address], [place],  [id number]  Basic metabolic panel      Status: Abnormal   Collection Time: 03/22/19  1:10 AM  Result Value Ref Range   Sodium 137 135 - 145 mmol/L   Potassium 4.0 3.5 - 5.1 mmol/L   Chloride 105 98 - 111 mmol/L   CO2 24 22 - 32 mmol/L   Glucose, Bld 97 70 - 99 mg/dL   BUN 13 6 - 20 mg/dL   Creatinine, Ser 0.83 0.61 - 1.24 mg/dL   Calcium 8.6 (L) 8.9 - 10.3 mg/dL   GFR calc non Af Amer >60 >60 mL/min   GFR calc Af Amer >60 >60  mL/min   Anion gap 8 5 - 15    Comment: Performed at [place] [address], [place], [place] [address]  Magnesium     Status: None   Collection Time: 03/22/19  1:10 AM  Result Value Ref Range   Magnesium 2.1 1.7 - 2.4 mg/dL    Comment: Performed at [place] [address], [place], [place] [address]  Phosphorus     Status: Abnormal   Collection Time: 03/22/19  1:10 AM  Result Value Ref Range   Phosphorus 2.2 (L) 2.5 - 4.6 mg/dL    Comment: Performed at [place] [address], [place], [place] [address]  Glucose, capillary     Status: Abnormal   Collection Time: 03/22/19  3:38 AM  Result Value Ref Range   Glucose-Capillary 108 (H) 70 - 99 mg/dL  Glucose, capillary     Status: Abnormal   Collection Time: 03/22/19  8:15 AM  Result Value Ref Range   Glucose-Capillary 111 (H) 70 - 99 mg/dL  Glucose, capillary     Status: Abnormal   Collection Time: 03/22/19 12:06 PM  Result Value Ref Range   Glucose-Capillary 107 (H) 70 - 99 mg/dL  Glucose, capillary     Status: Abnormal   Collection Time: 03/22/19  7:57 PM  Result Value Ref Range   Glucose-Capillary 118 (H) 70 - 99 mg/dL  Glucose, capillary     Status: Abnormal   Collection Time: 03/22/19 11:58 PM  Result Value Ref Range   Glucose-Capillary 143 (H) 70 - 99 mg/dL  CBC     Status: Abnormal   Collection Time: 03/23/19  3:02 AM  Result Value Ref Range   WBC 16.9 (H) 4.0 - 10.5 K/uL   RBC 3.81 (L) 4.22 - 5.81 MIL/uL   Hemoglobin 10.3 (L) 13.0 - 17.0 g/dL   HCT 32.2 (L) 39.0 - 52.0 %   MCV 84.5 80.0 -  100.0 fL   MCH 27.0 26.0 - 34.0 pg   MCHC 32.0 30.0 - 36.0 g/dL   RDW 15.0 11.5 - 15.5 %   Platelets 362 150 - 400 K/uL   nRBC 0.0 0.0 - 0.2 %    Comment: Performed at [facility], [place]. [address], [place], [place] [id number]  Basic metabolic panel     Status: Abnormal   Collection Time: 03/23/19  3:02 AM  Result Value Ref Range   Sodium 139 135 - 145 mmol/L   Potassium 3.8 3.5 - 5.1 mmol/L   Chloride 107 98 - 111 mmol/L   CO2 23 22 - 32 mmol/L   Glucose, Bld 139 (H) 70 - 99 mg/dL   BUN 9 6 - 20 mg/dL   Creatinine, Ser 0.79 0.61 - 1.24 mg/dL   Calcium 8.8 (L) 8.9 - 10.3 mg/dL   GFR calc non Af Amer >60 >60 mL/min   GFR calc Af Amer >60 >60 mL/min   Anion gap 9 5 - 15    Comment: Performed at [facility], [place] [address] [place], [place] [address]  Glucose, capillary     Status: Abnormal   Collection Time: 03/23/19  4:38 AM  Result Value Ref Range   Glucose-Capillary 123 (H) 70 - 99 mg/dL    Imaging / Studies: No results found.  Medications / Allergies: per chart  Antibiotics: Anti-infectives (From admission, onward)   Start     Dose/Rate Route Frequency Ordered Stop   03/19/19 1930  ceFAZolin (  ANCEF) IVPB 2g/100 mL premix     2 g 200 mL/hr over 30 Minutes Intravenous Every 8 hours 03/19/19 1716 03/20/19 0227   03/19/19 1016  ceFAZolin (ANCEF) 2-4 GM/100ML-% IVPB    Note to Pharmacy: Gustavo Lah   : cabinet override      03/19/19 1016 03/19/19 1146   03/19/19 1015  ceFAZolin (ANCEF) IVPB 2g/100 mL premix     2 g 200 mL/hr over 30 Minutes Intravenous On call to O.R. 03/19/19 1014 03/19/19 1146        Note: Portions of this report may have been transcribed using voice recognition software. Every effort was made to ensure accuracy; however, inadvertent computerized transcription errors may be present.   Any transcriptional errors that result from this process are unintentional.     Adin Hector, MD, FACS, MASCRS Gastrointestinal and Minimally  Invasive Surgery    [address], [place], [place] [address] [phone] Main / Paging [phone] Fax

## 2019-03-24 ENCOUNTER — Encounter (HOSPITAL_COMMUNITY): Payer: Self-pay | Admitting: Surgery

## 2019-03-24 DIAGNOSIS — D638 Anemia in other chronic diseases classified elsewhere: Secondary | ICD-10-CM

## 2019-03-24 LAB — BASIC METABOLIC PANEL
Anion gap: 7 (ref 5–15)
BUN: 9 mg/dL (ref 6–20)
CO2: 23 mmol/L (ref 22–32)
Calcium: 8.7 mg/dL — ABNORMAL LOW (ref 8.9–10.3)
Chloride: 108 mmol/L (ref 98–111)
Creatinine, Ser: 0.76 mg/dL (ref 0.61–1.24)
GFR calc Af Amer: >60 mL/min (ref >60)
GFR calc non Af Amer: >60 mL/min (ref >60)
Glucose, Bld: 138 mg/dL — ABNORMAL HIGH (ref 70–99)
Potassium: 3.8 mmol/L (ref 3.5–5.1)
Sodium: 138 mmol/L (ref 135–145)

## 2019-03-24 LAB — CBC
HCT: 31.9 % — ABNORMAL LOW (ref 39.0–52.0)
Hemoglobin: 10.3 g/dL — ABNORMAL LOW (ref 13.0–17.0)
MCH: 26.9 pg (ref 26.0–34.0)
MCHC: 32.3 g/dL (ref 30.0–36.0)
MCV: 83.3 fL (ref 80.0–100.0)
Platelets: 395 10*3/uL (ref 150–400)
RBC: 3.83 MIL/uL — ABNORMAL LOW (ref 4.22–5.81)
RDW: 14.7 % (ref 11.5–15.5)
WBC: 13.5 10*3/uL — ABNORMAL HIGH (ref 4.0–10.5)
nRBC: 0 % (ref 0.0–0.2)

## 2019-03-24 LAB — GLUCOSE, CAPILLARY
Glucose-Capillary: 121 mg/dL — ABNORMAL HIGH (ref 70–99)
Glucose-Capillary: 125 mg/dL — ABNORMAL HIGH (ref 70–99)
Glucose-Capillary: 130 mg/dL — ABNORMAL HIGH (ref 70–99)
Glucose-Capillary: 132 mg/dL — ABNORMAL HIGH (ref 70–99)
Glucose-Capillary: 135 mg/dL — ABNORMAL HIGH (ref 70–99)
Glucose-Capillary: 145 mg/dL — ABNORMAL HIGH (ref 70–99)
Glucose-Capillary: 161 mg/dL — ABNORMAL HIGH (ref 70–99)

## 2019-03-24 MED ORDER — OSMOLITE 1.5 CAL PO LIQD
1000.0000 mL | ORAL | Status: DC
  Administered 2019-03-24: 1000 mL
  Filled 2019-03-24 (×3): qty 1000

## 2019-03-24 NOTE — Progress Notes (Addendum)
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  - Tumor is limited to submucosa, see comment.  - Resection margins are negative.  - Thirteen of thirteen lymph nodes negative for carcinoma (0/13).  - Benign omentum.  - See oncology table.   ONCOLOGY TABLE:   STOMACH:   Procedure: Partial gastrectomy and omentectomy.  Tumor Site: Distal stomach  Tumor Size: 1.8 cm  Histologic Type: Adenocarcinoma  Histologic Grade: G1, well differentiated  Tumor Extension: Limited to submucosa.  Margins: Uninvolved by tumor  Treatment Effect: No know presurgical therapy  Lymphovascular Invasion: Not identified  Regional Lymph Nodes:    Number of Lymph Nodes Involved: 0     Number of Lymph Nodes Examined: 13  Pathologic Stage Classification (pTNM, AJCC 8th Edition): pT1b, pN0  Ancillary Studies: [Can be performed upon request]  Representative Tumor Block: A4  Comment(s): In areas adjacent to tumor the muscularis propria is  replaced by fibrosis, but the tumor appears to be limited to the  submucosa. Dr. Vic Brown has reviewed select slides.   (v4.1.0.0)    INTRAOPERATIVE DIAGNOSIS:   MARGIN AT STITCH: NEGATIVE FOR CARCINOMA. DR. Vic Brown    Evan Brown DESCRIPTION:   Received fresh for rapid intraoperative consult is a partial gastrectomy  and a separate 26 x 16 x 1.5 cm portion of omentum. The gastrectomy is  previously partially open and there is a suture attached at the proximal  margin identifying the area to be evaluated at frozen section. The  portion of stomach measures 19 cm along the greater curvature and 8 cm  along the lesser curvature. There is a 2 cm cuff of duodenum present.  There is a 1.8 x 1.5 cm ulcer present along the lesser curvature. The  ulcer is located 5 cm from the proximal margin and 7 cm from the distal  margin. The cut surface at the ulcer shows solid white tissue  infiltrating the wall with a maximum thickness of 0.7 cm. Extension  beyond the wall is not grossly identified. The uninvolved mucosa is  glistening, tan with preservation of the rugal folds. There are 14  rubbery tan-pink ovoid nodules grossly consistent with lymph nodes  measuring 0.3 to 0.8 cm in greatest dimension.  Sections are submitted in 14 cassettes.  1, 2 = sections at proximal margin with suture, submitted for frozen  section  3 = distal margin  4-8 = entire ulcerated lesion  9 = representative section of stomach proximal to ulcer  10 = representative section of stomach distal to ulcer  11, 12, 13 = lymph nodes  14 = sections of omentum. [facility] 03/20/2019)    Final Diagnosis performed by Evan Males, MD.  Electronically signed  03/21/2019   Technical component performed at [facility], [place] [address], [place], [place] [address].  Professional component performed at [facility],  [place] [address] [place], [place] [address].  Immunohistochemistry Technical component (if applicable) was performed  at [facility]. [address], [place],  [place], [place] [address].  IMMUNOHISTOCHEMISTRY DISCLAIMER (if applicable):  Some of these immunohistochemical stains may have been developed and the  performance characteristics determine by [facility]. Some  may not have been cleared or approved by the U.S. Food and Drug  Administration. The FDA has determined that such clearance or approval  is not necessary. This test is used for clinical purposes. It should not  be regarded as investigational or for research. This laboratory is  certified under the [place]  (CLIA-88) as qualified to perform high complexity clinical laboratory  testing. The controls stained appropriately.    Assessment  Recovering well so far  [facility] Stay = 5 days)  Plan:  -Advance to dysphagia 1 pured diet as tolerated.  -tube feeds to goal of 70 mL/ hour.    -Follow oral intake.  If gastric ileus fully resolved, may be able to wean off tube feeds this admission.  May switch to nightly tube feeds.  Defer to Dr. Barry Brown  Pathology shows early cancer pT1b, pN0, encouraging.    Pain and nausea control.  PPI with history of gastrectomy and bad heartburn/reflux  -VTE prophylaxis- SCDs, etc  -mobilize as tolerated to help recovery  20 minutes spent in review, evaluation, examination, counseling, and coordination of care.  More than 50% of that time was spent in counseling.  03/24/2019    Subjective: (Chief complaint)  Tolerating liquids.  Tube feeds do not seem to have been advanced yet.  Walking in hallways.  Objective:   Vital signs:  Vitals:   03/23/19 0436 03/23/19 1350 03/23/19 2017 03/24/19 0422  BP: (!) 164/99 (!) 170/92 (!) 161/89 (!) 156/81  Pulse: 94 94 87 87  Resp: 18  18 19   Temp: 99.6 F (37.6 C) 98.1 F (36.7 C) 99.3 F (37.4 C) 99.3 F (37.4 C)  TempSrc: Oral Oral Oral Oral  SpO2: 95% 99% 95% 96%  Weight:      Height:        Last BM Date: 03/22/19  Intake/Output   Yesterday:  12/05 0701 - 12/06 0700 In: 2190 [P.O.:1690; NG/GT:500] Out: 2950 [Urine:2950] This shift:  No intake/output data recorded.  Bowel function:  Flatus: YES  BM:  YES  Drain: (No drain)   Physical Exam:  General: Pt awake/alert/oriented x4 in no acute distress Eyes: PERRL, normal EOM.  Sclera clear.  No icterus Neuro: CN II-XII intact w/o focal sensory/motor deficits. Lymph: No head/neck/groin lymphadenopathy Psych:  No delerium/psychosis/paranoia HENT: Normocephalic, Mucus membranes moist.  No thrush Neck: Supple, No tracheal deviation Chest: No chest wall pain w good excursion CV:  Pulses intact.  Regular rhythm MS: Normal AROM mjr joints.  No obvious deformity  Abdomen: Soft.  Nondistended.  Mildly tender at incisions only.  Dressings clean dry intact.  Jejunostomy tube site clean.  Tube feeds at  35 mL an hour no evidence of peritonitis.  No incarcerated hernias.  Ext:   No deformity.  No mjr edema.  No cyanosis Skin: No petechiae / purpura  Results:   Cultures: Recent Results (from the past 720 hour(s))  SARS CORONAVIRUS 2 (TAT 6-24 HRS) Nasopharyngeal Nasopharyngeal Swab     Status: None   Collection Time: 03/15/19 11:08 AM   Specimen: Nasopharyngeal Swab  Result Value Ref Range Status   SARS Coronavirus 2 NEGATIVE NEGATIVE Final    Comment: (NOTE) SARS-CoV-2 target nucleic acids are NOT DETECTED. The SARS-CoV-2 RNA is generally detectable in upper and lower respiratory specimens during the acute phase of infection. Negative results do not preclude SARS-CoV-2 infection, do not  rule out co-infections with other pathogens, and should not be used as the sole basis for treatment or other Evan Brown management decisions. Negative results must be combined with clinical observations, Evan Brown history, and epidemiological information. The expected result is Negative. Fact Sheet for Patients: SugarRoll.be Fact Sheet for Healthcare Providers: https://www.woods-mathews.com/ This test is not yet approved or cleared by the [place] FDA and  has been authorized for detection and/or diagnosis of SARS-CoV-2 by FDA under an Emergency Use Authorization (EUA). This EUA will remain  in effect (meaning this test can be used) for the duration of the COVID-19 declaration under Section 56 4(b)(1) of the Act, 21 U.S.C. section 360bbb-3(b)(1), unless the authorization is terminated or revoked sooner. Performed at [facility], [place] [address] [place], [place] [address]     Labs: Results for orders placed or performed during the hospital encounter of 03/19/19 (from the past 48 hour(s))  Glucose, capillary     Status: Abnormal   Collection Time: 03/22/19 12:06 PM  Result Value Ref Range   Glucose-Capillary 107 (H) 70 - 99 mg/dL  Glucose, capillary     Status: Abnormal   Collection Time: 03/22/19  7:57 PM  Result Value Ref Range   Glucose-Capillary 118 (H) 70 - 99 mg/dL  Glucose, capillary     Status: Abnormal   Collection Time: 03/22/19 11:58 PM  Result Value Ref Range   Glucose-Capillary 143 (H) 70 - 99 mg/dL  CBC     Status: Abnormal   Collection Time: 03/23/19  3:02 AM  Result Value Ref Range   WBC 16.9 (H) 4.0 - 10.5 K/uL   RBC 3.81 (L) 4.22 - 5.81 MIL/uL   Hemoglobin 10.3 (L) 13.0 - 17.0 g/dL   HCT 32.2 (L) 39.0 - 52.0 %   MCV 84.5 80.0 - 100.0 fL   MCH 27.0 26.0 - 34.0 pg   MCHC 32.0 30.0 - 36.0 g/dL   RDW 15.0 11.5 - 15.5 %   Platelets 362 150 - 400 K/uL   nRBC 0.0 0.0 - 0.2 %    Comment: Performed at [facility], [place] [address], [place], [place] [id number]  Basic metabolic panel     Status: Abnormal   Collection Time: 03/23/19  3:02 AM  Result Value Ref Range   Sodium 139 135 - 145 mmol/L   Potassium 3.8 3.5 - 5.1 mmol/L   Chloride 107 98 - 111 mmol/L   CO2 23 22 - 32 mmol/L   Glucose, Bld 139 (H) 70 - 99 mg/dL   BUN 9 6 - 20 mg/dL   Creatinine, Ser 0.79 0.61 - 1.24 mg/dL   Calcium 8.8 (L) 8.9 - 10.3 mg/dL   GFR calc non Af Amer >60 >60 mL/min   GFR calc  Af Amer >60 >60 mL/min   Anion gap 9 5 - 15    Comment: Performed at [place] [address] [place], [place] [address]  Glucose, capillary     Status: Abnormal   Collection Time: 03/23/19  4:38 AM  Result Value Ref Range   Glucose-Capillary 123 (H) 70 - 99 mg/dL  Glucose, capillary     Status: Abnormal   Collection Time: 03/23/19  8:24 AM  Result Value Ref Range   Glucose-Capillary 117 (H) 70 - 99 mg/dL  Glucose, capillary     Status: Abnormal   Collection Time: 03/23/19 12:16 PM  Result Value Ref Range   Glucose-Capillary 131 (H) 70 - 99 mg/dL  Glucose, capillary     Status: Abnormal   Collection Time: 03/23/19  4:57 PM  Result Value Ref Range   Glucose-Capillary 126 (H) 70 - 99 mg/dL  Glucose, capillary     Status: Abnormal   Collection Time: 03/23/19  8:21 PM  Result Value Ref Range   Glucose-Capillary 118 (H) 70 - 99 mg/dL  Glucose, capillary     Status: Abnormal   Collection Time: 03/24/19 12:24 AM  Result Value Ref Range   Glucose-Capillary 135 (H) 70 - 99 mg/dL  CBC     Status: Abnormal   Collection Time: 03/24/19  3:32 AM  Result Value Ref Range   WBC 13.5 (H) 4.0 - 10.5 K/uL   RBC 3.83 (L) 4.22 - 5.81 MIL/uL   Hemoglobin 10.3 (L) 13.0 - 17.0 g/dL   HCT 31.9 (L) 39.0 - 52.0 %   MCV 83.3 80.0 - 100.0 fL   MCH 26.9 26.0 - 34.0 pg   MCHC 32.3 30.0 - 36.0 g/dL   RDW 14.7 11.5 - 15.5 %   Platelets 395 150 - 400 K/uL   nRBC 0.0 0.0 - 0.2 %    Comment: Performed at [facility], [place]. [address] [place], [place] [id number]  Basic metabolic panel     Status: Abnormal   Collection Time: 03/24/19  3:32 AM  Result Value Ref Range   Sodium 138 135 - 145 mmol/L   Potassium 3.8 3.5 - 5.1 mmol/L   Chloride 108 98 - 111 mmol/L   CO2 23 22 - 32 mmol/L   Glucose, Bld 138 (H) 70 - 99 mg/dL   BUN 9 6 - 20 mg/dL   Creatinine, Ser 0.76 0.61 - 1.24 mg/dL   Calcium 8.7 (L) 8.9 - 10.3 mg/dL   GFR calc non Af Amer >60 >60 mL/min   GFR calc Af Amer >60 >60 mL/min   Anion gap 7 5 - 15    Comment: Performed at [facility], [place] [address] [place], [place] [address]  Glucose, capillary     Status: Abnormal   Collection Time: 03/24/19  4:20 AM  Result Value Ref Range   Glucose-Capillary 145 (H) 70 - 99 mg/dL  Glucose, capillary     Status: Abnormal   Collection Time: 03/24/19  7:40 AM  Result Value Ref Range   Glucose-Capillary 121 (H) 70 - 99 mg/dL    Imaging / Studies: No results found.  Medications / Allergies: per chart  Antibiotics: Anti-infectives (From admission, onward)   Start     Dose/Rate Route Frequency Ordered Stop   03/19/19 1930  ceFAZolin (ANCEF) IVPB 2g/100 mL premix     2 g 200 mL/hr over 30 Minutes Intravenous Every 8 hours 03/19/19 1716 03/20/19 0227   03/19/19  1016  ceFAZolin (ANCEF) 2-4 GM/100ML-% IVPB    Note to Pharmacy: Gustavo Lah   : cabinet override      03/19/19 1016 03/19/19 1146   03/19/19 1015  ceFAZolin (ANCEF) IVPB 2g/100 mL premix     2 g 200 mL/hr over 30 Minutes Intravenous On call to O.R. 03/19/19 1014 03/19/19 1146        Note: Portions of this report may have been transcribed using voice recognition software. Every effort was made to ensure accuracy; however, inadvertent computerized transcription errors may be present.   Any transcriptional errors that result from this process are unintentional.     Adin Hector, MD, FACS, MASCRS Gastrointestinal and Minimally Invasive Surgery    [address] [place],  [place] [address] [phone] Main / Paging [phone] Fax

## 2019-03-25 LAB — GLUCOSE, CAPILLARY
Glucose-Capillary: 124 mg/dL — ABNORMAL HIGH (ref 70–99)
Glucose-Capillary: 118 mg/dL — ABNORMAL HIGH (ref 70–99)
Glucose-Capillary: 126 mg/dL — ABNORMAL HIGH (ref 70–99)
Glucose-Capillary: 92 mg/dL (ref 70–99)
Glucose-Capillary: 151 mg/dL — ABNORMAL HIGH (ref 70–99)

## 2019-03-25 MED ORDER — OSMOLITE 1.5 CAL PO LIQD
1000.0000 mL | ORAL | Status: DC
  Administered 2019-03-25: 1000 mL
  Filled 2019-03-25 (×3): qty 1000

## 2019-03-25 MED ORDER — ENSURE ENLIVE PO LIQD: 237.0000 mL | Freq: Three times a day (TID) | ORAL | Status: DC

## 2019-03-25 NOTE — Progress Notes (Signed)
6 Days Post-Op   Subjective/Chief Complaint: Doing well.  Work toward d/c.    Objective: Vital signs in last 24 hours: Temp:  [98.7 F (37.1 C)-99.1 F (37.3 C)] 98.7 F (37.1 C) (12/07 0451) Pulse Rate:  [89-92] 92 (12/07 0451) Resp:  [18] 18 (12/07 0451) BP: (151-154)/(89-95) 151/89 (12/07 0451) SpO2:  [99 %-100 %] 100 % (12/07 0451) Last BM Date: 03/24/19  Intake/Output from previous day: 12/06 0701 - 12/07 0700 In: 240 [P.O.:240] Out: 400 [Urine:400] Intake/Output this shift: No intake/output data recorded.  General appearance: sitting up in chair.   Resp: breathing comfortably Cardio: regular rate and rhythm GI: soft, non distended, dressing c/d/i.  approp tender.  J tube in place. Extremities: extremities normal, atraumatic, no cyanosis or edema  Lab Results:  Recent Labs    03/23/19 0302 03/24/19 0332  WBC 16.9* 13.5*  HGB 10.3* 10.3*  HCT 32.2* 31.9*  PLT 362 395   BMET Recent Labs    03/23/19 0302 03/24/19 0332  NA 139 138  K 3.8 3.8  CL 107 108  CO2 23 23  GLUCOSE 139* 138*  BUN 9 9  CREATININE 0.79 0.76  CALCIUM 8.8* 8.7*   PT/INR No results for input(s): LABPROT, INR in the last 72 hours. ABG No results for input(s): PHART, HCO3 in the last 72 hours.  Invalid input(s): PCO2, PO2  Studies/Results: No results found.  Anti-infectives: Anti-infectives (From admission, onward)   Start     Dose/Rate Route Frequency Ordered Stop   03/19/19 1930  ceFAZolin (ANCEF) IVPB 2g/100 mL premix     2 g 200 mL/hr over 30 Minutes Intravenous Every 8 hours 03/19/19 1716 03/20/19 0227   03/19/19 1016  ceFAZolin (ANCEF) 2-4 GM/100ML-% IVPB    Note to Pharmacy: Gustavo Lah   : cabinet override      03/19/19 1016 03/19/19 1146   03/19/19 1015  ceFAZolin (ANCEF) IVPB 2g/100 mL premix     2 g 200 mL/hr over 30 Minutes Intravenous On call to O.R. 03/19/19 1014 03/19/19 1146      Assessment/Plan: s/p Procedure(s): LAPAROSCOPY DIAGNOSTIC (N/A) PARTIAL  GASTRECTOMY (N/A) INSERTION OF JEJUNOSTOMY TUBE (N/A) gastric cancer-  pT1bN0!!! Dysphagia diet. Work toward home tomorrow.      LOS: 6 days    Evan Brown 03/25/2019

## 2019-03-25 NOTE — Progress Notes (Signed)
Nutrition Follow-up  DOCUMENTATION CODES:   Non-severe (moderate) malnutrition in context of chronic illness  INTERVENTION:  D/C Boost Breeze  Ensure Enlive TID (each supplement provides 350 calories and 20 grams of protein)  D/C continuous TF, switch to Nocturnal feeds of Osmolite 1.5 @70mL /hr  from 1700 to 0700 beginning 12/7  This regimen will provide 1470 kcal, 61 grams of protein and 747 mL free water meeting 56% of estimated calorie needs and 51% of estimated protein needs.   NUTRITION DIAGNOSIS:   Moderate Malnutrition related to chronic illness(Gastric Cancer) as evidenced by mild fat depletion, moderate fat depletion, mild muscle depletion. ongoing  GOAL:   Patient will meet greater than or equal to 90% of their needs currently met with TF, progressing with nocturnal feeds  MONITOR:   TF tolerance, Weight trends, Labs, Skin, I & O's  REASON FOR ASSESSMENT:   Consult Enteral/tube feeding initiation and management  ASSESSMENT:   The patient is a 54 year old male who presents with gastric cancer. Pt is a 53 yo M referred by Dr. Watt Climes for a new diagnosis of gastric cancer 12/2018.  He presented with abdominal pain in July 2020.  He continued to lose weight and had severe pain so returned to the ED 12/27/2018. Pt has no documented PMH, but reports prior alcohol use and currently smoking 4 cigarettes per day. Admitted for surgery scheduled for 12/1.  Pathology report finds  Adenocarcinoma spanning 1.8 cm Grade: G1, well differentiated Limited to submucosa Margins uninvolved by tumor 13 lymph nodes examined, all negative for carcinoma Pathologic Stage Classification (pTNM, AJCC 8th Edition): pT1b, pN0   Per Case Management, pt has already been taught tube feedings via bedside nurse. [facility] and [place] orders for [facility].  Pt denies any nausea, vomiting or discomfort with food or drink on dysphagia 1 diet. States that he sat up to eat his  breakfast of grits this morning and sat up for 45 minutes after eating before getting back in bed. Pt has been able to ambulate around his room and to the restroom on his own.   After breakfast he States that he felt like he "ate a four course meal." and " I don't normally eat but two meals a day at home, but I eat three here because I know I need to eat more to heal up." RD explained that with reduced volume of the stomach after surgery, pt will need to focus on small, frequent meals in order to meet needs and avoid feeling overfull. Pt was educated on avoiding foods with high sugar content and high fiber content initially after discharge, to slowly introduce these foods and assess tolerance. Pt was encouraged to drink ensure supplements that will be sent to his room and was educated on where to find those to drink at home on discharge. Pt endorses preferring the strawberry flavor.   Pt continues to be wt stable.   Labs and medications reviewed.  Diet Order:   Diet Order            DIET - DYS 1 Room service appropriate? Yes; Fluid consistency: Thin  Diet effective now              EDUCATION NEEDS:   Education needs have been addressed  Skin:  Skin Assessment: Skin Integrity Issues: Skin Integrity Issues:: Incisions Incisions: abdomen  Last BM:  12/6  Height:   Ht Readings from Last 1 Encounters:  03/19/19 6' 3"  (1.905 m)  Weight:   Wt Readings from Last 1 Encounters:  03/21/19 96.3 kg    Ideal Body Weight:  89.1 kg  BMI:  Body mass index is 26.54 kg/m.  Estimated Nutritional Needs:   Kcal:  2600-2800  Protein:  120-130 grams  Fluid:  >/= 2 L  Meda Klinefelter, Dietetic Intern

## 2019-03-25 NOTE — TOC Initial Note (Addendum)
Transition of Care [facility]) - Initial/Assessment Note    Patient Details  Name: Evan Brown MRN: [id number] Date of Birth: Nov 06, 1965  [facility] CM/SW Contact:    Marilu Favre, RN Phone Number: 03/25/2019, 11:34 AM  Clinical Narrative:                 Spoke with patient at bedside. Patient from home alone. Has already been taught tube feedings per bedside nurse and patient. Patient aware home health RN will not be present daily. Explained plan for tube feeds at home will be 14 hours a day at a rate of 70cc/hr. Spoke to [employer] RD, once note entered will order tube feeds. Zack with [facility] aware and waiting orders. Valerie with [facility] also awaiting HHRN. Both will see if patient qualifies for charity care.  Pharmacy changed to [facility]. Will see if [facility] covers scripts. Patient has no PCP will call for appointment.   Patient consent for NCM to call friend [place]. Patient and Roselind Rily voiced understanding to all of above.   Patient's address is [address] [place], [place] [address] [address]  Tube feeding orders given to Zack with [facility] and Home health orders given to [facility] with [facility].  Expected Discharge Plan: [place] Barriers to Discharge: Continued Medical Work up   Patient Goals and CMS Choice Patient states their goals for this hospitalization and ongoing recovery are:: to go home CMS Medicare.gov Compare Post Acute Care list provided to:: Patient Choice offered to / list presented to : Patient  Expected Discharge Plan and Services Expected Discharge Plan: [place]   Discharge Planning Services: CM Consult, [facility], [facility] Program, Medication Assistance Post Acute Care Choice: Home Health, Durable Medical Equipment Living arrangements for the past 2 months: Apartment                 DME Arranged: Tube feeding, Tube feeding pump DME Agency: [facility] Date DME  Agency Contacted: 03/25/19 Time DME Agency Contacted: [phone] Representative spoke with at DME Agency: [place]: RN [place] Agency: [place] ([place]) Date [place]: 03/25/19 Time [place]: 71 Representative spoke with at [place]: [place]  Prior Living Arrangements/Services Living arrangements for the past 2 months: [place] with:: Self Patient language and need for interpreter reviewed:: Yes Do you feel safe going back to the place where you live?: Yes            Criminal Activity/Legal Involvement Pertinent to Current Situation/Hospitalization: No - Comment as needed  Activities of Daily Living Home Assistive Devices/Equipment: None ADL Screening (condition at time of admission) Patient's cognitive ability adequate to safely complete daily activities?: Yes Is the patient deaf or have difficulty hearing?: No Does the patient have difficulty seeing, even when wearing glasses/contacts?: No Does the patient have difficulty concentrating, remembering, or making decisions?: No Patient able to express need for assistance with ADLs?: Yes Does the patient have difficulty dressing or bathing?: No Independently performs ADLs?: Yes (appropriate for developmental age) Does the patient have difficulty walking or climbing stairs?: Yes Weakness of Legs: Both Weakness of Arms/Hands: None  Permission Sought/Granted Permission sought to share information with : Case Manager Permission granted to share information with : Yes, Verbal Permission Granted  Share Information with NAME: Roselind Rily girl friend           Emotional Assessment Appearance:: Appears stated age Attitude/Demeanor/Rapport: Engaged Affect (  typically observed): Accepting Orientation: : Oriented to Self, Oriented to Place, Oriented to  Time, Oriented to Situation Alcohol / Substance Use: Not Applicable Psych Involvement: No (comment)  Admission diagnosis:  Gastric cancer [facility])  [C16.9] Patient Active Problem List   Diagnosis Date Noted  . Anemia, chronic disease 03/24/2019  . Malnutrition of moderate degree 03/20/2019  . Gastric cancer pT1pN0 (0/12LN) s/p subtotal gastrectomy/B2 reconstruction 03/19/2019 03/19/2019   PCP:  Patient, No Pcp Per Pharmacy:   [facility] [place], [place] [address] Phone: [phone] Fax: [phone]  [id number] [place], [place] [address] Phone: [phone] Fax: [phone]  [facility] [place], [place] [address] [place] [address] Phone: [phone] Fax: [phone]     Social Determinants of Health (SDOH) Interventions    Readmission Risk Interventions No flowsheet data found.

## 2019-03-26 LAB — GLUCOSE, CAPILLARY
Glucose-Capillary: 120 mg/dL — ABNORMAL HIGH (ref 70–99)
Glucose-Capillary: 136 mg/dL — ABNORMAL HIGH (ref 70–99)
Glucose-Capillary: 137 mg/dL — ABNORMAL HIGH (ref 70–99)

## 2019-03-26 MED ORDER — OSMOLITE 1.5 CAL PO LIQD: 1000.0000 mL | ORAL | 1 refills | Status: DC

## 2019-03-26 MED ORDER — HYDROCORTISONE (PERIANAL) 2.5 % EX CREA: 1.0000 "application " | TOPICAL_CREAM | Freq: Four times a day (QID) | CUTANEOUS | 0 refills | Status: DC | PRN

## 2019-03-26 MED ORDER — ENSURE ENLIVE PO LIQD: 237.0000 mL | Freq: Three times a day (TID) | ORAL | 12 refills | Status: DC

## 2019-03-26 MED ORDER — OXYCODONE HCL 5 MG PO TABS: 5.0000 mg | ORAL_TABLET | Freq: Four times a day (QID) | ORAL | 0 refills | Status: DC | PRN

## 2019-03-26 MED FILL — oxyCODONE HCL 5 MG TABS: 5 | 2 days supply | Qty: 10 | Fill #0

## 2019-03-26 NOTE — TOC Progression Note (Signed)
Transition of Care [facility]) - Progression Note    Patient Details  Name: Evan Brown MRN: [id number] Date of Birth: [birth year]  Transition of Care [facility]) CM/SW Contact  [place] Shimmel, Edson Snowball, RN Phone Number: 03/26/2019, 10:05 AM  Clinical Narrative:     Mateo Flow with [facility] aware of discharge today.  Zack with [facility] will bring tube feeds to patient's room prior to discharge.   [facility] pharmacy working on prescriptions.   Expected Discharge Plan: [place] Barriers to Discharge: Continued Medical Work up  Expected Discharge Plan and Services Expected Discharge Plan: [place]   Discharge Planning Services: CM Consult, [facility], [facility], Medication Assistance Post Acute Care Choice: Home Health, Durable Medical Equipment Living arrangements for the past 2 months: Apartment Expected Discharge Date: 03/26/19               DME Arranged: Tube feeding, Tube feeding pump DME Agency: [facility] Date DME Agency Contacted: 03/25/19 Time DME Agency Contacted: 67 Representative spoke with at DME Agency: [place]: RN [place] Agency: [place] ([place]) Date [place]: 03/25/19 Time [place]: 1133 Representative spoke with at [place]: [place] ([place]) Interventions    Readmission Risk Interventions No flowsheet data found.

## 2019-03-26 NOTE — Progress Notes (Signed)
Called TOC and med is ready.  Nurse will call sister and tell her to come pick him up. Payment to be made by sister for meds.

## 2019-03-26 NOTE — Discharge Planning (Signed)
Patient discharged home in stable condition. Verbalizes understanding of all discharge instructions, including home medications and follow up appointments. 

## 2019-03-26 NOTE — Discharge Instructions (Signed)
[facility]      [facility] °[phone] ° °ABDOMINAL SURGERY: POST OP INSTRUCTIONS ° °Always review your discharge instruction sheet given to you by the facility where your surgery was performed. ° °IF YOU HAVE DISABILITY OR FAMILY LEAVE FORMS, YOU MUST BRING THEM TO THE OFFICE FOR PROCESSING.  PLEASE DO NOT GIVE THEM TO YOUR DOCTOR. ° °1. A prescription for pain medication may be given to you upon discharge.  Take your pain medication as prescribed, if needed.  If narcotic pain medicine is not needed, then you may take acetaminophen (Tylenol) or ibuprofen (Advil) as needed. °2. Take your usually prescribed medications unless otherwise directed. °3. If you need a refill on your pain medication, please contact your pharmacy. They will contact our office to request authorization.  Prescriptions will not be filled after 5pm or on week-ends. °4. You should follow a light diet the first few days after arrival home, such as soup and crackers, pudding, etc.unless your doctor has advised otherwise. A high-fiber, low fat diet can be resumed as tolerated.   Be sure to include lots of fluids daily. Most patients will experience some swelling and bruising on the chest and neck area.  Ice packs will help.  Swelling and bruising can take several days to resolve °5. Most patients will experience some swelling and bruising in the area of the incision. Ice pack will help. Swelling and bruising can take several days to resolve..  °6. It is common to experience some constipation if taking pain medication after surgery.  Increasing fluid intake and taking a stool softener will usually help or prevent this problem from occurring.  A mild laxative (Milk of Magnesia or Miralax) should be taken according to package directions if there are no bowel movements after 48 hours. °7.  You may have steri-strips (small skin tapes) in place directly over the incision.  These strips should be left on the skin for 10-14 days.  If your  surgeon used skin glue on the incision, you may shower in 48 hours.  The glue will flake off over the next 2-3 weeks.  Any sutures or staples will be removed at the office during your follow-up visit. You may find that a light gauze bandage over your incision may keep your staples from being rubbed or pulled. You may shower and replace the bandage daily. °8. ACTIVITIES:  You may resume regular (light) daily activities beginning the next day--such as daily self-care, walking, climbing stairs--gradually increasing activities as tolerated.  You may have sexual intercourse when it is comfortable.  Refrain from any heavy lifting or straining until approved by your doctor. °a. You may drive when you no longer are taking prescription pain medication, you can comfortably wear a seatbelt, and you can safely maneuver your car and apply brakes °b. Return to Work: __________8 weeks if applicable_________________________ °9. You should see your doctor in the office for a follow-up appointment approximately two weeks after your surgery.  Make sure that you call for this appointment within a day or two after you arrive home to insure a convenient appointment time. °OTHER INSTRUCTIONS:  °_____________________________________________________________ °_____________________________________________________________ ° °WHEN TO CALL YOUR DOCTOR: °1. Fever over 101.0 °2. Inability to urinate °3. Nausea and/or vomiting °4. Extreme swelling or bruising °5. Continued bleeding from incision. °6. Increased pain, redness, or drainage from the incision. °7. Difficulty swallowing or breathing °8. Muscle cramping or spasms. °9. Numbness or tingling in hands or feet or around lips. ° °The clinic staff is   available to answer your questions during regular business hours.  Please don't hesitate to call and ask to speak to one of the nurses if you have concerns.  For further questions, please visit [email or web address]      How to Give  a Feeding Through a Jejunostomy Feeding Tube A feeding tube is a soft, flexible tube through which medicine, water, and liquid food (formulaor breastmilk) can be given. A person may have a feeding tube if he or she has trouble swallowing or cannot have food or medicine by mouth. A health care provider may also give you more specific instructions. If you have problems or questions, contact a health care provider. Supplies needed:  Prescribed formula.  Feeding bag set.  Feeding tube pump.  Pole to hang feeding.  20-60 mL syringe to check tube placement.  A syringe to flush the feeding tube.  Sterile or purified water. Follow these guidelines: ? Use sterile water if the person has a weak immune system and has difficulty fighting off infections (is immunocompromised), or if you are unsure about the amount of chemical contaminants in purified or drinking water. ? Purify drinking water by boiling it for at least 1 minute. Keep a lid over the water while it boils. Allow the water to cool to room temperature before using it.   How to give a feeding through a feeding tube pump  1. Have all supplies ready and available. 2. Wash your hands with soap and water. 3. Check the placement of the feeding tube as directed. 4. Raise the head of the person 30-45, or as directed. 5. Pour the prescribed amount of formula into the feeding bag set. 6. Hang the feeding bag set from a pole. Formula that is prepared in a sterile way can hang for up to 8 hours. For a newborn, hang time should be limited to 4 hours. 7. Prime the entire feeding bag set with the formula. To do this, allow the liquid to travel through the entire set to the end of the tubing. 8. Cap the feeding bag set until after the feeding tube has been flushed. 9. Load the feeding bag set into the feeding tube pump. 10. Clamp or kink the feeding tube before removing the cap and as you are disconnecting syringes and feeding tubing. 11. Uncap the  end of the feeding tube. 12. Use a syringe to flush the feeding tube with purified or sterile water as directed. 13. Uncap the feeding bag set. 14. Connect the feeding bag set to the feeding tube. 15. Set the prescribed feeding rate on the feeding tube pump. 16. Start the feeding tube pump.   Contact a health care provider if:  You are having trouble doing a tube feeding.  You are not sure what type of formula to give.  The feeding tube is clogged, falls out, or does not work.  The person who is getting the feeding has unusual weight loss or weight gain.   Get help right away if:  The skin area around the feeding tube is red, swollen, warm to the touch, or tender.  There is drainage coming from the area around the feeding tube.  The drainage coming from the area around the feeding tube has a bad smell.  The person who is getting the feeding develops any of these problems: ? Vomiting or nausea. ? Fever. ? Constipation or diarrhea. ? A large, bloated stomach.   Summary  A person may have a feeding tube if he or   she has trouble swallowing or cannot have food or medicine by mouth.  You can give formula or breast milk through the tube.  Have all of your supplies ready and available before giving a feeding.  If you have problems or questions, contact a health care provider. This information is not intended to replace advice given to you by your health care provider. Make sure you discuss any questions you have with your health care provider.   

## 2019-03-27 NOTE — Discharge Summary (Signed)
Physician Discharge Summary  Patient ID: Evan Brown MRN: [id number] DOB/AGE: February 03, 1966 53 y.o.  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  He had a fair amount of bloody drainage from his NGT for the first 1-2 days post op.  His foley was removed POD 2 along with NGT.  Trophic tube feeds were started on POD 2 as well.  Pathology came back as pT1bN0.  His oral diet was slowly advanced as well as his tube feeds.  He was originally on a PCA with an on Q pain pump.  This was removed and the PCA was discontinued as he was able to tolerate more of a diet.  Tube feeds were advanced to goal as his bowel function increased.  He was eating a moderate amount during the day, so he was discharged on feeds at 70 ml/hr for 14 hours with soft diet.  He spoke with nutrition about recommendations regarding diet choices after partial gastrectomy.  He was ambulatory.  He was requiring only a bit of oral pain medication by the time of discharge.  He was discharged on POD 7.  Staples were removed prior to d/c.    Consults: None  Significant Diagnostic Studies: labs: HCT prior to d/c was 31.9. Electrolytes were normal.    Treatments: surgery: see above  Discharge Exam: Blood pressure (!) 158/94, pulse 95, temperature 98.2 F (36.8 C), temperature source Oral, resp. rate 16, height 6\' 3"  (1.905 m), weight 96.3 kg, SpO2 97 %. General appearance: alert, cooperative and no distress Resp: breathing comfortably, doing IS GI: soft, incision c/d/i with staples.  no drainage  or erythema.   Extremities: extremities normal, atraumatic, no cyanosis or edema  Disposition:   Discharge Instructions    Call MD for:  difficulty breathing, headache or visual disturbances   Complete by: As directed    Call MD for:  hives   Complete by: As directed    Call MD for:  persistant dizziness or light-headedness   Complete by: As directed    Call MD for:  persistant nausea and vomiting   Complete by: As directed    Call MD for:  redness, tenderness, or signs of infection (pain, swelling, redness, odor or green/yellow discharge around incision site)   Complete by: As directed    Call MD for:  severe uncontrolled pain   Complete by: As directed    Call MD for:  temperature >100.4   Complete by: As directed    Diet - low sodium heart healthy   Complete by: As directed    Increase activity slowly   Complete by: As directed      Allergies as of 03/26/2019   No Known Allergies     Medication List    STOP taking these medications   famotidine 20 MG tablet Commonly known as: PEPCID   sucralfate 1 GM/10ML suspension Commonly known as: Carafate     TAKE these medications   acetaminophen 500 MG tablet Commonly known as: TYLENOL Take 500 mg by mouth every 6 (six) hours as needed for moderate pain.   feeding supplement (ENSURE ENLIVE) Liqd Take 237 mLs by  mouth 3 (three) times daily between meals.   feeding supplement (OSMOLITE 1.5 CAL) Liqd Place 1,000 mLs into feeding tube continuous.   hydrocortisone 2.5 % rectal cream Commonly known as: ANUSOL-HC Apply 1 application topically 4 (four) times daily as needed for hemorrhoids.   omeprazole 40 MG capsule Commonly known as: PriLOSEC Take 1 capsule (40 mg total) by mouth daily.   ondansetron 4 MG disintegrating tablet Commonly known as: Zofran ODT Take 1 tablet (4 mg total) by mouth every 8 (eight) hours as needed for nausea or vomiting.   oxyCODONE 5 MG immediate release tablet Commonly known as: Oxy  IR/ROXICODONE Take 1 tablet (5 mg total) by mouth every 6 (six) hours as needed for severe pain.   promethazine 25 MG tablet Commonly known as: PHENERGAN Take 1 tablet (25 mg total) by mouth every 6 (six) hours as needed for nausea or vomiting.   traMADol 50 MG tablet Commonly known as: ULTRAM Take 1 tablet (50 mg total) by mouth every 6 (six) hours as needed for severe pain.      Follow-up Information    [facility].   Specialty: Home Health Services Why: [facility] Follow up.   Why: [facility], supply tube feeds Contact information: [place] [address] [phone]        [name], MD Follow up in 2 week(s).   Specialty: General Surgery Contact information: [address] [place]  [address] [phone]           Signed: [name] 03/27/2019, 11:02 AM

## 2020-11-24 IMAGING — CT CT ABDOMEN AND PELVIS WITH CONTRAST
2 of 5 series · 16 of 46 positions shown, 18 images · IV contrast (omnipaque)
Comparison: 01/09/2016

CLINICAL DATA: Abdominal pain for 2 days, generalized abdominal
pain, no relief with omeprazole, smoker

EXAM:
CT ABDOMEN AND PELVIS WITH CONTRAST
TECHNIQUE: Multidetector CT imaging of the abdomen and pelvis was performed
using the standard protocol following bolus administration of
intravenous contrast. Sagittal and coronal MPR images reconstructed
from axial data set.
CONTRAST:  100mL OMNIPAQUE IOHEXOL 300 MG/ML SOLN IV. No oral
contrast.

[Series 2: axial st · axial · 0.79mm/px · z∈[-603,-188]mm · 13 of 97 slices shown, 15 images]
[im 7/97  soft-tissue]
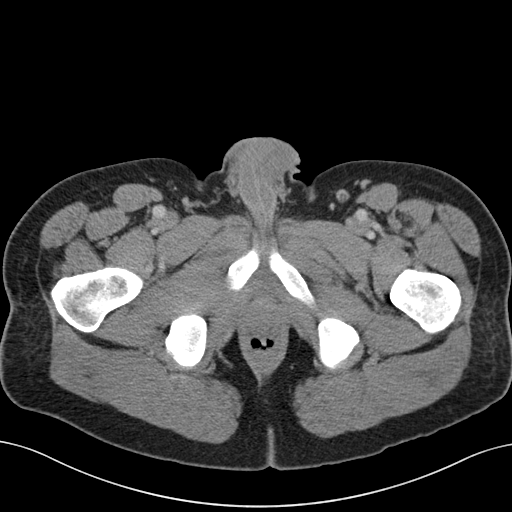
[im 7/97  bone]
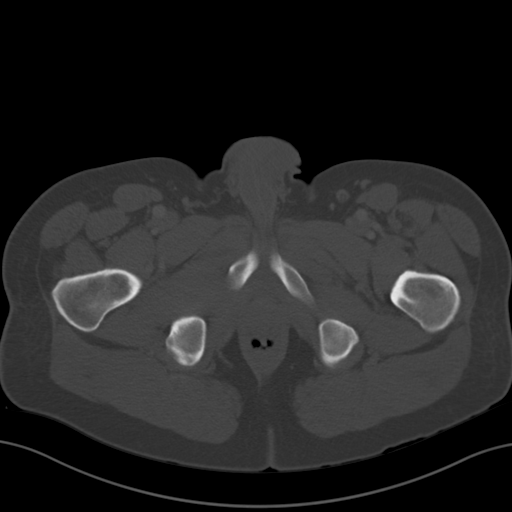
[im 13/97  soft-tissue]
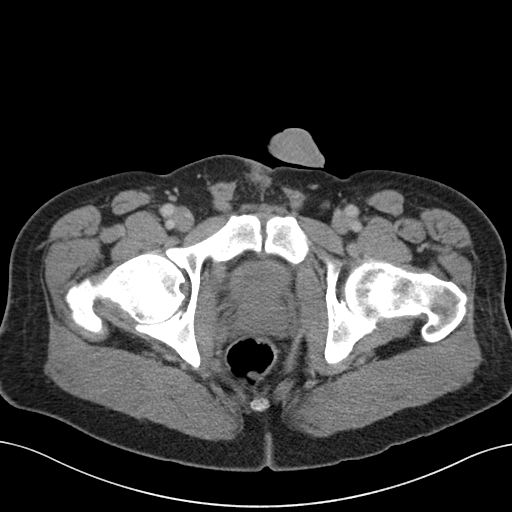
[im 20/97  soft-tissue]
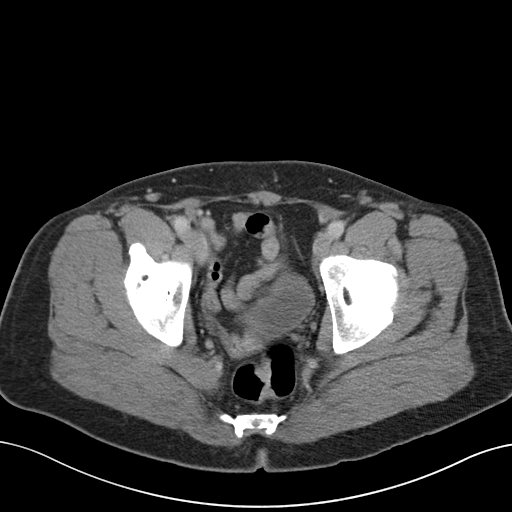
[im 26/97  soft-tissue]
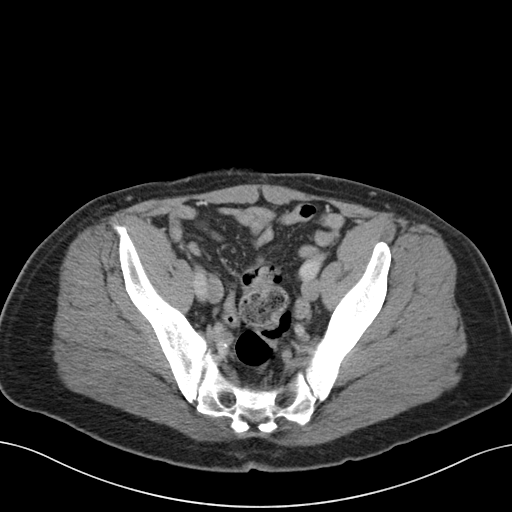
[im 33/97  soft-tissue]
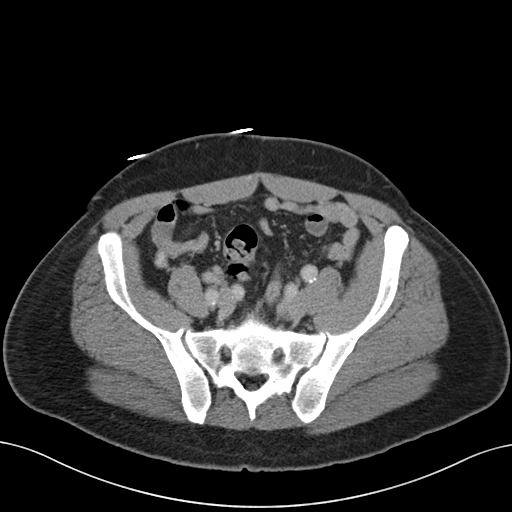
[im 39/97  soft-tissue]
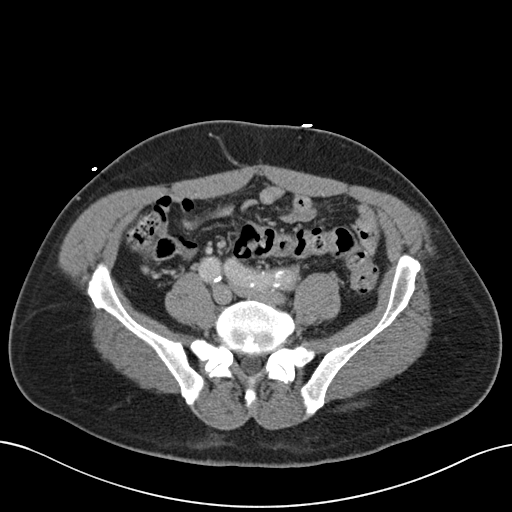
[im 52/97  soft-tissue]
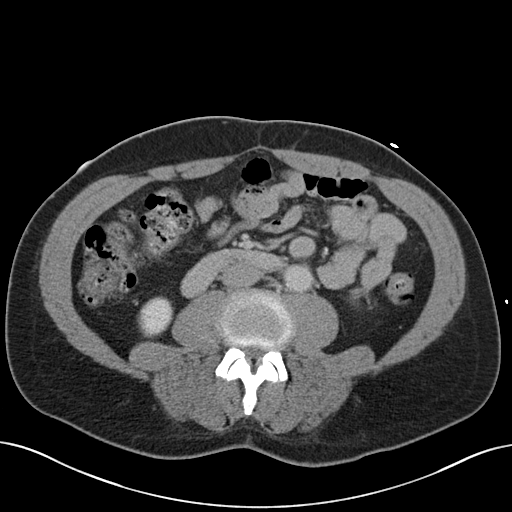
[im 58/97  soft-tissue]
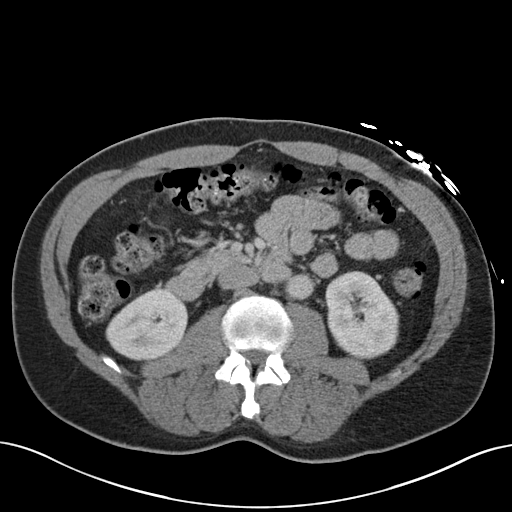
[im 65/97  soft-tissue]
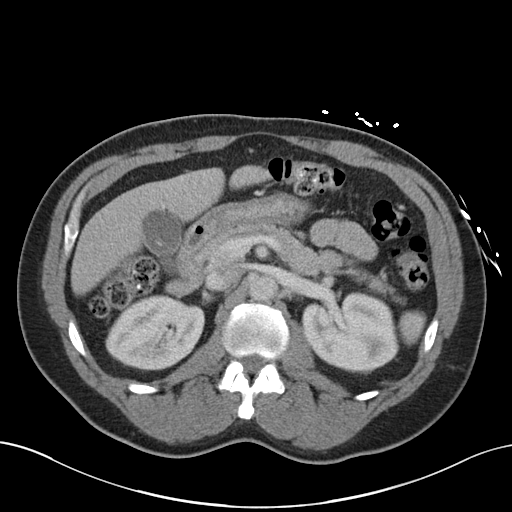
[im 65/97  bone]
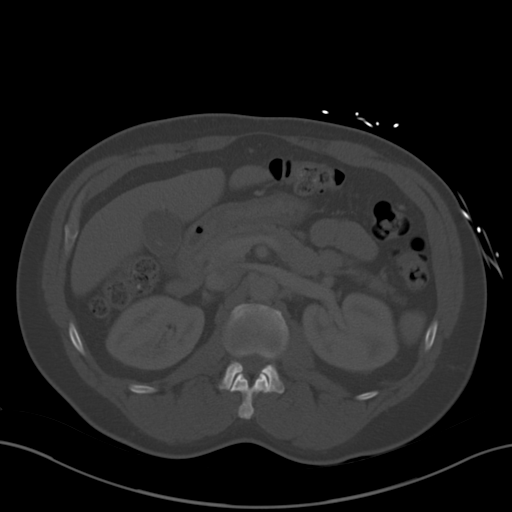
[im 71/97  soft-tissue]
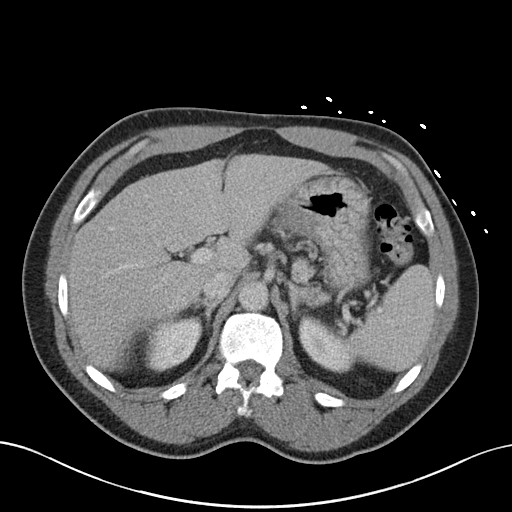
[im 77/97  soft-tissue]
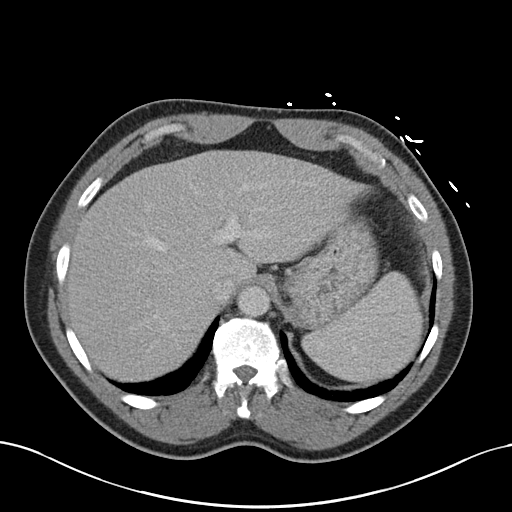
[im 84/97  soft-tissue]
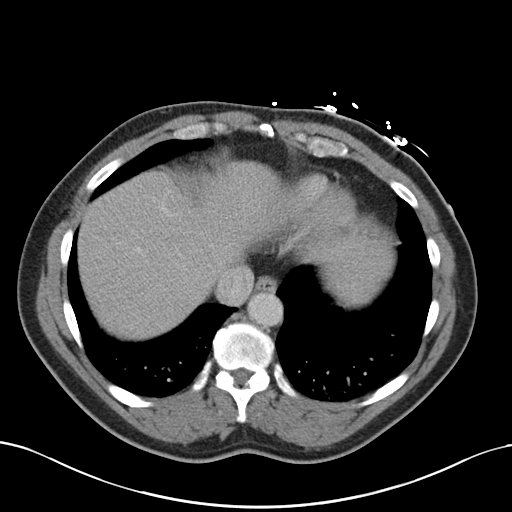
[im 90/97  soft-tissue]
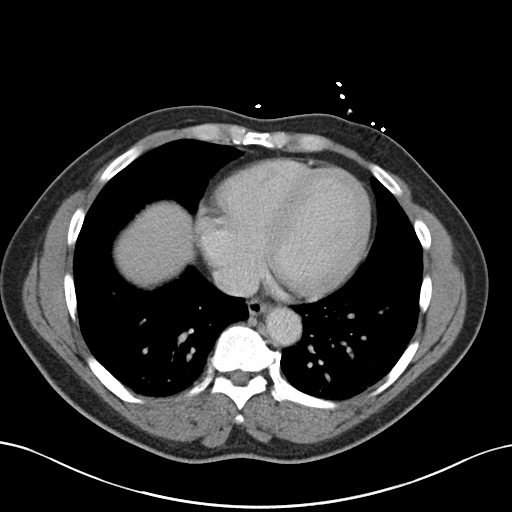

[Series 5: coronal st · coronal · 0.83mm/px · 3 of 139 slices shown]
[im 47/139  soft-tissue]
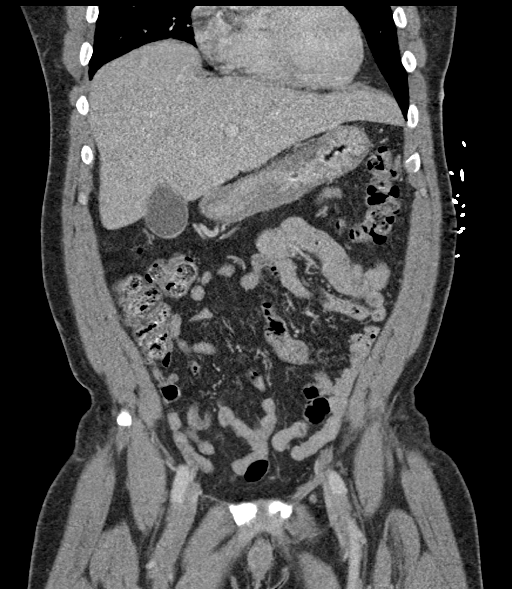
[im 62/139  soft-tissue]
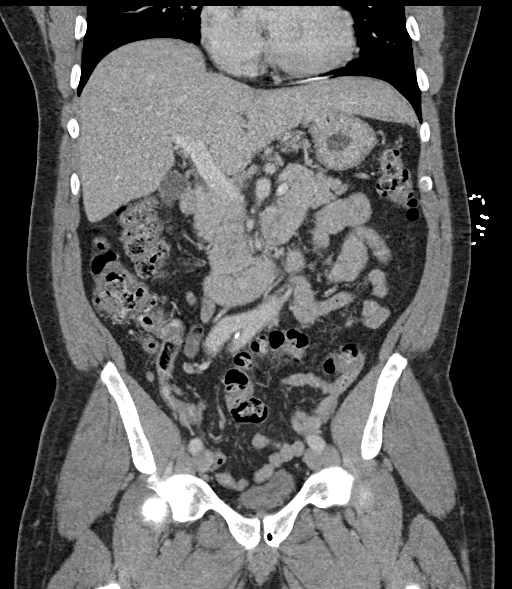
[im 77/139  soft-tissue]
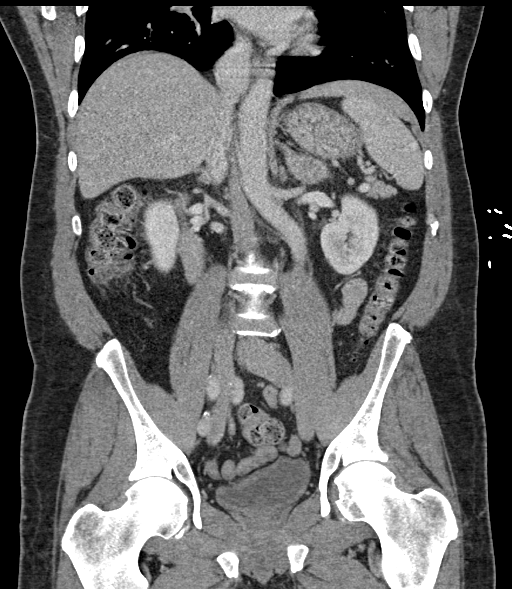

[16 of 46 positions shown; findings below may reference images not displayed]

FINDINGS: Lower chest: Mild dependent bibasilar atelectasis

Hepatobiliary: Single small calculus within gallbladder. Gallbladder
and liver otherwise normal appearance. No biliary dilatation.

Pancreas: Normal appearance

Spleen: Normal appearance

Adrenals/Urinary Tract: LEFT adrenal mass 2.7 x 1.9 cm image 29,
demonstrating significant washout of contrast on delayed images
consistent with adrenal adenoma. RIGHT adrenal gland unremarkable.
Kidneys, ureters, and poorly distended bladder otherwise normal
appearance.

Stomach/Bowel: Stomach incompletely distended, limiting assessment.
Gastric wall appears thickened and irregular on the previous exam,
showing only a questionable focus of wall thickening at the lesser
curve. Remainder stomach distends normally. Stomach and bowel loops
normal appearance. Appendix normal.

Vascular/Lymphatic: Atherosclerotic calcifications of aorta and
iliac arteries, which are tortuous but normal in caliber. No
adenopathy.

Reproductive: Unremarkable seminal vesicles and prostate gland

Other: No free air or free fluid. No hernia or inflammatory process.

Musculoskeletal: No acute osseous findings.
IMPRESSION: Slight increase in size of a LEFT adrenal mass with washout
characteristics consistent with an adrenal adenoma.

Significantly improved gastric wall thickening since the previous
study with only a single segment of questionable wall thickening at
the lesser curve of the mid stomach, cannot exclude gastritis or
mass; follow-up upper endoscopy or upper GI assessment recommended.

Cholelithiasis.

## 2021-09-18 ENCOUNTER — Encounter (HOSPITAL_COMMUNITY): Payer: Self-pay | Admitting: Emergency Medicine

## 2021-09-18 ENCOUNTER — Inpatient Hospital Stay (HOSPITAL_COMMUNITY)
Admission: EM | Admit: 2021-09-18 | Discharge: 2021-09-22 | DRG: 378 | Disposition: A | Discharge status: Home / Self Care | Payer: Medicaid Other | Attending: Student | Admitting: Student

## 2021-09-18 ENCOUNTER — Other Ambulatory Visit: Payer: Self-pay

## 2021-09-18 DIAGNOSIS — K2950 Unspecified chronic gastritis without bleeding: Secondary | ICD-10-CM

## 2021-09-18 DIAGNOSIS — K219 Gastro-esophageal reflux disease without esophagitis: Secondary | ICD-10-CM | POA: Diagnosis present

## 2021-09-18 DIAGNOSIS — D649 Anemia, unspecified: Principal | ICD-10-CM | POA: Diagnosis present

## 2021-09-18 DIAGNOSIS — D62 Acute posthemorrhagic anemia: Secondary | ICD-10-CM | POA: Diagnosis present

## 2021-09-18 DIAGNOSIS — Z85028 Personal history of other malignant neoplasm of stomach: Secondary | ICD-10-CM | POA: Diagnosis present

## 2021-09-18 DIAGNOSIS — K635 Polyp of colon: Secondary | ICD-10-CM | POA: Diagnosis present

## 2021-09-18 DIAGNOSIS — Z934 Other artificial openings of gastrointestinal tract status: Secondary | ICD-10-CM

## 2021-09-18 DIAGNOSIS — K644 Residual hemorrhoidal skin tags: Secondary | ICD-10-CM

## 2021-09-18 DIAGNOSIS — Z79899 Other long term (current) drug therapy: Secondary | ICD-10-CM

## 2021-09-18 DIAGNOSIS — N529 Male erectile dysfunction, unspecified: Secondary | ICD-10-CM | POA: Diagnosis present

## 2021-09-18 DIAGNOSIS — D75838 Other thrombocytosis: Secondary | ICD-10-CM | POA: Diagnosis present

## 2021-09-18 DIAGNOSIS — K648 Other hemorrhoids: Secondary | ICD-10-CM

## 2021-09-18 DIAGNOSIS — Z716 Tobacco abuse counseling: Secondary | ICD-10-CM

## 2021-09-18 DIAGNOSIS — E538 Deficiency of other specified B group vitamins: Secondary | ICD-10-CM

## 2021-09-18 DIAGNOSIS — K5730 Diverticulosis of large intestine without perforation or abscess without bleeding: Secondary | ICD-10-CM

## 2021-09-18 DIAGNOSIS — F1490 Cocaine use, unspecified, uncomplicated: Secondary | ICD-10-CM | POA: Diagnosis present

## 2021-09-18 DIAGNOSIS — K449 Diaphragmatic hernia without obstruction or gangrene: Secondary | ICD-10-CM | POA: Diagnosis present

## 2021-09-18 DIAGNOSIS — F17210 Nicotine dependence, cigarettes, uncomplicated: Secondary | ICD-10-CM | POA: Diagnosis present

## 2021-09-18 DIAGNOSIS — Z903 Acquired absence of stomach [part of]: Secondary | ICD-10-CM

## 2021-09-18 DIAGNOSIS — Z79891 Long term (current) use of opiate analgesic: Secondary | ICD-10-CM

## 2021-09-18 DIAGNOSIS — R634 Abnormal weight loss: Secondary | ICD-10-CM | POA: Diagnosis present

## 2021-09-18 DIAGNOSIS — D75839 Thrombocytosis, unspecified: Secondary | ICD-10-CM

## 2021-09-18 DIAGNOSIS — K2951 Unspecified chronic gastritis with bleeding: Principal | ICD-10-CM | POA: Diagnosis present

## 2021-09-18 LAB — CBC
HCT: 15.3 % — ABNORMAL LOW (ref 39.0–52.0)
Hemoglobin: 3.8 g/dL — CL (ref 13.0–17.0)
MCH: 15 pg — ABNORMAL LOW (ref 26.0–34.0)
MCHC: 24.8 g/dL — ABNORMAL LOW (ref 30.0–36.0)
MCV: 60.2 fL — ABNORMAL LOW (ref 80.0–100.0)
Platelets: 451 10*3/uL — ABNORMAL HIGH (ref 150–400)
RBC: 2.54 MIL/uL — ABNORMAL LOW (ref 4.22–5.81)
RDW: 22.6 % — ABNORMAL HIGH (ref 11.5–15.5)
WBC: 8.7 10*3/uL (ref 4.0–10.5)
nRBC: 0.3 % — ABNORMAL HIGH (ref 0.0–0.2)

## 2021-09-18 LAB — COMPREHENSIVE METABOLIC PANEL
ALT: 16 U/L (ref 0–44)
AST: 13 U/L — ABNORMAL LOW (ref 15–41)
Albumin: 3.4 g/dL — ABNORMAL LOW (ref 3.5–5.0)
Alkaline Phosphatase: 92 U/L (ref 38–126)
Anion gap: 6 (ref 5–15)
BUN: 13 mg/dL (ref 6–20)
CO2: 23 mmol/L (ref 22–32)
Calcium: 8.8 mg/dL — ABNORMAL LOW (ref 8.9–10.3)
Chloride: 111 mmol/L (ref 98–111)
Creatinine, Ser: 0.83 mg/dL (ref 0.61–1.24)
GFR, Estimated: >60 mL/min (ref >60)
Glucose, Bld: 88 mg/dL (ref 70–99)
Potassium: 4 mmol/L (ref 3.5–5.1)
Sodium: 140 mmol/L (ref 135–145)
Total Bilirubin: 0.4 mg/dL (ref 0.3–1.2)
Total Protein: 6.5 g/dL (ref 6.5–8.1)

## 2021-09-18 LAB — RETICULOCYTES
Immature Retic Fract: 24.9 % — ABNORMAL HIGH (ref 2.3–15.9)
RBC.: 2.5 MIL/uL — ABNORMAL LOW (ref 4.22–5.81)
Retic Count, Absolute: 48.8 10*3/uL (ref 19.0–186.0)
Retic Ct Pct: 2 % (ref 0.4–3.1)

## 2021-09-18 LAB — PREPARE RBC (CROSSMATCH)

## 2021-09-18 LAB — POC OCCULT BLOOD, ED: Fecal Occult Bld: NEGATIVE

## 2021-09-18 MED ORDER — SODIUM CHLORIDE 0.9 % IV SOLN: 10.0000 mL/h | Freq: Once | INTRAVENOUS | Status: DC

## 2021-09-18 MED ORDER — IOHEXOL 9 MG/ML PO SOLN
ORAL | Status: AC
  Filled 2021-09-18: qty 1000

## 2021-09-18 MED ORDER — IOHEXOL 9 MG/ML PO SOLN: 500.0000 mL | ORAL | Status: AC

## 2021-09-18 MED ORDER — SODIUM CHLORIDE 0.9% IV SOLUTION: Freq: Once | INTRAVENOUS | Status: DC

## 2021-09-18 NOTE — ED Notes (Signed)
Dr. Regenia Skeeter informed of pt hemoglobin 3.8

## 2021-09-18 NOTE — ED Provider Notes (Signed)
[facility] EMERGENCY DEPARTMENT Provider Note   CSN: [id number] Arrival date & time: 09/18/21  2042     History  Chief Complaint  Patient presents with   Abdominal Pain    Evan Brown is a 56 y.o. male.  The history is provided by the patient.  Abdominal Pain He has history of gastric cancer, anemia and was told to come to the hospital when a routine blood draw showed a severe anemia.  He had surgery for his gastric cancer in 2020, and had not been to a physician until 2 days ago.  He has noticed that he is feeling generally weak and short of breath and has had about a 20 pound involuntary weight loss over the last 4 months.  He states his appetite has been good.  He denies any melena or hematochezia.  He states he has not had any nausea or vomiting, but his wife states that he has vomited occasionally.  He has been having intermittent abdominal pain which varies in location.  When he went to see the physician 2 days ago, he was told that he had to come in because of the low hemoglobin, but did not come in until tonight.  As a separate complaint, he has noted erectile dysfunction over the last 6 months.   Home Medications Prior to Admission medications   Medication Sig Start Date End Date Taking? Authorizing Provider  acetaminophen (TYLENOL) 500 MG tablet Take 500 mg by mouth every 6 (six) hours as needed for moderate pain.    [provider]  feeding supplement, ENSURE ENLIVE, (ENSURE ENLIVE) LIQD Take 237 mLs by mouth 3 (three) times daily between meals. 03/26/19   [name], MD  hydrocortisone (ANUSOL-HC) 2.5 % rectal cream Apply 1 application topically 4 (four) times daily as needed for hemorrhoids. 03/26/19   [name], MD  Nutritional Supplements (FEEDING SUPPLEMENT, OSMOLITE 1.5 CAL,) LIQD Place 1,000 mLs into feeding tube continuous. 03/26/19   [name], MD  omeprazole (PRILOSEC) 40 MG capsule Take 1 capsule (40 mg total) by mouth daily.  11/05/18   Petrucelli, Samantha R, PA-C  ondansetron (ZOFRAN ODT) 4 MG disintegrating tablet Take 1 tablet (4 mg total) by mouth every 8 (eight) hours as needed for nausea or vomiting. Patient not taking: Reported on 12/27/2018 11/05/18   Petrucelli, Aldona Bar R, PA-C  oxyCODONE (OXY IR/ROXICODONE) 5 MG immediate release tablet Take 1 tablet (5 mg total) by mouth every 6 (six) hours as needed for severe pain. 03/26/19   [name], MD  promethazine (PHENERGAN) 25 MG tablet Take 1 tablet (25 mg total) by mouth every 6 (six) hours as needed for nausea or vomiting. Patient not taking: Reported on 03/07/2019 12/27/18   Dalia Heading, PA-C  traMADol (ULTRAM) 50 MG tablet Take 1 tablet (50 mg total) by mouth every 6 (six) hours as needed for severe pain. Patient not taking: Reported on 03/07/2019 12/27/18   Dalia Heading, PA-C      Allergies    Patient has no known allergies.    Review of Systems   Review of Systems  Gastrointestinal:  Positive for abdominal pain.  All other systems reviewed and are negative.  Physical Exam Updated Vital Signs BP 136/79   Pulse 87   Temp 98.4 F (36.9 C) (Oral)   Resp 16   SpO2 100%  Physical Exam Vitals and nursing note reviewed.  56 year old male, resting comfortably and in no acute distress. Vital signs are normal. Oxygen  saturation is 100%, which is normal. Head is normocephalic and atraumatic. PERRLA, EOMI. Oropharynx is clear. Neck is nontender and supple without adenopathy or JVD. Back is nontender and there is no CVA tenderness. Lungs are clear without rales, wheezes, or rhonchi. Chest is nontender. Heart has regular rate and rhythm with a 2/6 systolic ejection murmur best heard along the left sternal border. Abdomen is soft, flat, nontender without masses or hepatosplenomegaly and peristalsis is normoactive. Rectal: Normal sphincter tone.  Small amount of light [place] stool which is Hemoccult negative.  No significant prostatic  enlargement noted. Extremities have trace pedal edema, full range of motion is present. Skin is warm and dry without rash. Neurologic: Mental status is normal, cranial nerves are intact, moves all extremities equally.  ED Results / Procedures / Treatments   Labs (all labs ordered are listed, but only abnormal results are displayed) Labs Reviewed  COMPREHENSIVE METABOLIC PANEL - Abnormal; Notable for the following components:      Result Value   Calcium 8.8 (*)    Albumin 3.4 (*)    AST 13 (*)    All other components within normal limits  CBC - Abnormal; Notable for the following components:   RBC 2.54 (*)    Hemoglobin 3.8 (*)    HCT 15.3 (*)    MCV 60.2 (*)    MCH 15.0 (*)    MCHC 24.8 (*)    RDW 22.6 (*)    Platelets 451 (*)    nRBC 0.3 (*)    All other components within normal limits  POC OCCULT BLOOD, ED  TYPE AND SCREEN   Radiology CT ABDOMEN PELVIS W CONTRAST  Result Date: 09/19/2021 CLINICAL DATA:  Epigastric pain low hemoglobin EXAM: CT ABDOMEN AND PELVIS WITH CONTRAST TECHNIQUE: Multidetector CT imaging of the abdomen and pelvis was performed using the standard protocol following bolus administration of intravenous contrast. RADIATION DOSE REDUCTION: This exam was performed according to the departmental dose-optimization program which includes automated exposure control, adjustment of the mA and/or kV according to patient size and/or use of iterative reconstruction technique. CONTRAST:  162m OMNIPAQUE IOHEXOL 300 MG/ML  SOLN COMPARISON:  CT 12/27/2018, 11/05/2018, 01/09/2016 FINDINGS: Lower chest: Lung bases demonstrate no acute consolidation or pleural effusion. Hepatobiliary: Small gallstone. No biliary dilatation. No focal hepatic abnormality. Pancreas: Unremarkable. No pancreatic ductal dilatation or surrounding inflammatory changes. Spleen: Normal in size without focal abnormality. Adrenals/Urinary Tract: Right adrenal gland is normal. 2.3 cm left adrenal mass stable  compared with the previous exams and presumably representing an adenoma, no follow-up imaging is recommended. Kidneys show no hydronephrosis. The bladder is slightly thick walled Stomach/Bowel: Interval distal gastrectomy with gastrojejunostomy. No obstructive features. Questionable gastric wall thickening of the gastric remanent and at the anastomosis. No dilated small bowel. Negative appendix. No acute bowel wall thickening Vascular/Lymphatic: Mild aortic atherosclerosis. No aneurysm. No suspicious lymph nodes. Reproductive: Prostate is unremarkable. Other: Negative for pelvic effusion or free air. Musculoskeletal: No acute osseous abnormality IMPRESSION: 1. No CT evidence for acute intra-abdominal or pelvic abnormality. 2. Interval distal gastrectomy with gastrojejunal anastomosis. Question mild wall thickening of the gastric remnant and at the anastomosis, this would be better evaluated with endoscopy 3. Small gallstone Electronically Signed   By: KDonavan FoilM.D.   On: 09/19/2021 03:35    Procedures Procedures  Cardiac monitor shows normal sinus rhythm, per my interpretation.  Medications Ordered in ED Medications - No data to display  ED Course/ Medical Decision Making/ A&P  Medical Decision Making Amount and/or Complexity of Data Reviewed Labs: ordered. Radiology: ordered.  Risk Prescription drug management. Decision regarding hospitalization.   Severe anemia in a patient with a history of gastric cancer.  I have reviewed and interpreted all of the laboratory tests.  Hemoglobin is 3.8 which is down from 10.3 on 03/24/2019.  MCV is very low and RDW significantly elevated, consistent with iron deficiency anemia.  Thrombocytosis is noted of uncertain cause, possibly reactive.  Comprehensive metabolic panel is significant only for minimally decreased albumin level, probably nutritional.  With his history of gastric cancer, GI blood loss is by far the most likely  cause, but less likely in the setting of Hemoccult negative stools.  Consider other types of blood loss, nutritional deficiency.  Doubt hemolysis in the setting of type and screen.  Doubt folate and B12 deficiency in the setting of severe microcytosis.  Other causes of GI bleed need to be considered including peptic ulcer disease, diverticulosis, AV malformation.  He will need to be admitted for blood transfusion and GI consultation.  We will send for CT of abdomen and pelvis to evaluate for possible causes of GI bleed and possible retroperitoneal bleeding.  Case is discussed with Dr. Cyd Silence of [facility] hospitalist, who agrees to admit the patient.  CT report shows no acute process, incidental finding of a gallstone.  I have independently viewed the images, and agree with the radiologist's interpretation.  CRITICAL CARE Performed by: Delora Fuel Total critical care time: 50 minutes Critical care time was exclusive of separately billable procedures and treating other patients. Critical care was necessary to treat or prevent imminent or life-threatening deterioration. Critical care was time spent personally by me on the following activities: development of treatment plan with patient and/or surrogate as well as nursing, discussions with consultants, evaluation of patient's response to treatment, examination of patient, obtaining history from patient or surrogate, ordering and performing treatments and interventions, ordering and review of laboratory studies, ordering and review of radiographic studies, pulse oximetry and re-evaluation of patient's condition.  Final Clinical Impression(s) / ED Diagnoses Final diagnoses:  Symptomatic anemia  Thrombocytosis    Rx / DC Orders ED Discharge Orders     None         Delora Fuel, MD 80/88/11 [id number]

## 2021-09-18 NOTE — ED Triage Notes (Signed)
Pt has a hx of gastric cancer.  States he has abdominal pain when he eats.  Reports having x-rays/ultrasound and was called and told he has bleeding internally and should come to the ED.  Pt has no bloody stools or urine and denies hematemesis.  Only endorses pain after eating.

## 2021-09-18 NOTE — ED Notes (Signed)
Pt signed electronic consent form for blood transfusion, available in chart. Witnessed by this RN

## 2021-09-19 ENCOUNTER — Observation Stay (HOSPITAL_COMMUNITY): Payer: Medicaid Other

## 2021-09-19 ENCOUNTER — Encounter (HOSPITAL_COMMUNITY): Payer: Self-pay | Admitting: Anesthesiology

## 2021-09-19 ENCOUNTER — Encounter (HOSPITAL_COMMUNITY): Payer: Self-pay | Admitting: Internal Medicine

## 2021-09-19 ENCOUNTER — Encounter (HOSPITAL_COMMUNITY)
Admission: EM | Disposition: A | Discharge status: Home / Self Care | Payer: Self-pay | Source: Home / Self Care | Attending: Internal Medicine

## 2021-09-19 DIAGNOSIS — D75838 Other thrombocytosis: Secondary | ICD-10-CM | POA: Diagnosis present

## 2021-09-19 DIAGNOSIS — F17210 Nicotine dependence, cigarettes, uncomplicated: Secondary | ICD-10-CM | POA: Diagnosis present

## 2021-09-19 DIAGNOSIS — Z85028 Personal history of other malignant neoplasm of stomach: Secondary | ICD-10-CM | POA: Diagnosis not present

## 2021-09-19 DIAGNOSIS — D649 Anemia, unspecified: Secondary | ICD-10-CM | POA: Diagnosis not present

## 2021-09-19 DIAGNOSIS — Z716 Tobacco abuse counseling: Secondary | ICD-10-CM | POA: Diagnosis not present

## 2021-09-19 DIAGNOSIS — Z903 Acquired absence of stomach [part of]: Secondary | ICD-10-CM | POA: Diagnosis not present

## 2021-09-19 DIAGNOSIS — K5730 Diverticulosis of large intestine without perforation or abscess without bleeding: Secondary | ICD-10-CM | POA: Diagnosis present

## 2021-09-19 DIAGNOSIS — K219 Gastro-esophageal reflux disease without esophagitis: Secondary | ICD-10-CM

## 2021-09-19 DIAGNOSIS — N529 Male erectile dysfunction, unspecified: Secondary | ICD-10-CM | POA: Diagnosis present

## 2021-09-19 DIAGNOSIS — Z934 Other artificial openings of gastrointestinal tract status: Secondary | ICD-10-CM | POA: Diagnosis not present

## 2021-09-19 DIAGNOSIS — K635 Polyp of colon: Secondary | ICD-10-CM | POA: Diagnosis present

## 2021-09-19 DIAGNOSIS — Z79891 Long term (current) use of opiate analgesic: Secondary | ICD-10-CM | POA: Diagnosis not present

## 2021-09-19 DIAGNOSIS — K449 Diaphragmatic hernia without obstruction or gangrene: Secondary | ICD-10-CM | POA: Diagnosis present

## 2021-09-19 DIAGNOSIS — E538 Deficiency of other specified B group vitamins: Secondary | ICD-10-CM | POA: Diagnosis present

## 2021-09-19 DIAGNOSIS — K2951 Unspecified chronic gastritis with bleeding: Secondary | ICD-10-CM | POA: Diagnosis present

## 2021-09-19 DIAGNOSIS — F1490 Cocaine use, unspecified, uncomplicated: Secondary | ICD-10-CM | POA: Diagnosis present

## 2021-09-19 DIAGNOSIS — D509 Iron deficiency anemia, unspecified: Secondary | ICD-10-CM | POA: Diagnosis not present

## 2021-09-19 DIAGNOSIS — K2950 Unspecified chronic gastritis without bleeding: Secondary | ICD-10-CM | POA: Diagnosis present

## 2021-09-19 DIAGNOSIS — K644 Residual hemorrhoidal skin tags: Secondary | ICD-10-CM | POA: Diagnosis present

## 2021-09-19 DIAGNOSIS — Z79899 Other long term (current) drug therapy: Secondary | ICD-10-CM | POA: Diagnosis not present

## 2021-09-19 DIAGNOSIS — K648 Other hemorrhoids: Secondary | ICD-10-CM | POA: Diagnosis present

## 2021-09-19 DIAGNOSIS — D62 Acute posthemorrhagic anemia: Secondary | ICD-10-CM | POA: Diagnosis present

## 2021-09-19 DIAGNOSIS — R634 Abnormal weight loss: Secondary | ICD-10-CM | POA: Diagnosis present

## 2021-09-19 LAB — IRON AND TIBC
Iron: 11 ug/dL — ABNORMAL LOW (ref 45–182)
Saturation Ratios: 2 % — ABNORMAL LOW (ref 17.9–39.5)
TIBC: 624 ug/dL — ABNORMAL HIGH (ref 250–450)
UIBC: 613 ug/dL

## 2021-09-19 LAB — COMPREHENSIVE METABOLIC PANEL
ALT: 13 U/L (ref 0–44)
AST: 13 U/L — ABNORMAL LOW (ref 15–41)
Albumin: 3 g/dL — ABNORMAL LOW (ref 3.5–5.0)
Alkaline Phosphatase: 74 U/L (ref 38–126)
Anion gap: 4 — ABNORMAL LOW (ref 5–15)
BUN: 9 mg/dL (ref 6–20)
CO2: 22 mmol/L (ref 22–32)
Calcium: 8.1 mg/dL — ABNORMAL LOW (ref 8.9–10.3)
Chloride: 110 mmol/L (ref 98–111)
Creatinine, Ser: 0.73 mg/dL (ref 0.61–1.24)
GFR, Estimated: >60 mL/min (ref >60)
Glucose, Bld: 79 mg/dL (ref 70–99)
Potassium: 4.3 mmol/L (ref 3.5–5.1)
Sodium: 136 mmol/L (ref 135–145)
Total Bilirubin: 1.3 mg/dL — ABNORMAL HIGH (ref 0.3–1.2)
Total Protein: 5.8 g/dL — ABNORMAL LOW (ref 6.5–8.1)

## 2021-09-19 LAB — CBC
HCT: 22.4 % — ABNORMAL LOW (ref 39.0–52.0)
HCT: 23.1 % — ABNORMAL LOW (ref 39.0–52.0)
Hemoglobin: 6.6 g/dL — CL (ref 13.0–17.0)
Hemoglobin: 6.9 g/dL — CL (ref 13.0–17.0)
MCH: 20.1 pg — ABNORMAL LOW (ref 26.0–34.0)
MCH: 20.2 pg — ABNORMAL LOW (ref 26.0–34.0)
MCHC: 29.5 g/dL — ABNORMAL LOW (ref 30.0–36.0)
MCHC: 29.9 g/dL — ABNORMAL LOW (ref 30.0–36.0)
MCV: 68.1 fL — ABNORMAL LOW (ref 80.0–100.0)
MCV: 67.7 fL — ABNORMAL LOW (ref 80.0–100.0)
Platelets: 407 10*3/uL — ABNORMAL HIGH (ref 150–400)
Platelets: 420 10*3/uL — ABNORMAL HIGH (ref 150–400)
RBC: 3.29 MIL/uL — ABNORMAL LOW (ref 4.22–5.81)
RBC: 3.41 MIL/uL — ABNORMAL LOW (ref 4.22–5.81)
RDW: 26.6 % — ABNORMAL HIGH (ref 11.5–15.5)
RDW: 27.1 % — ABNORMAL HIGH (ref 11.5–15.5)
WBC: 8.7 10*3/uL (ref 4.0–10.5)
WBC: 9.4 10*3/uL (ref 4.0–10.5)
nRBC: 0.3 % — ABNORMAL HIGH (ref 0.0–0.2)
nRBC: 0.2 % (ref 0.0–0.2)

## 2021-09-19 LAB — PROTIME-INR
INR: 1.1 (ref 0.8–1.2)
INR: 1.2 (ref 0.8–1.2)
Prothrombin Time: 14.6 seconds (ref 11.4–15.2)
Prothrombin Time: 14.9 seconds (ref 11.4–15.2)

## 2021-09-19 LAB — POCT I-STAT, CHEM 8
BUN: 9 mg/dL (ref 6–20)
Calcium, Ion: 1.18 mmol/L (ref 1.15–1.40)
Chloride: 105 mmol/L (ref 98–111)
Creatinine, Ser: 0.7 mg/dL (ref 0.61–1.24)
Glucose, Bld: 130 mg/dL — ABNORMAL HIGH (ref 70–99)
HCT: 23 % — ABNORMAL LOW (ref 39.0–52.0)
Hemoglobin: 7.8 g/dL — ABNORMAL LOW (ref 13.0–17.0)
Potassium: 4.3 mmol/L (ref 3.5–5.1)
Sodium: 139 mmol/L (ref 135–145)
TCO2: 22 mmol/L (ref 22–32)

## 2021-09-19 LAB — MAGNESIUM: Magnesium: 2.2 mg/dL (ref 1.7–2.4)

## 2021-09-19 LAB — APTT: aPTT: 32 seconds (ref 24–36)

## 2021-09-19 LAB — PREPARE RBC (CROSSMATCH)

## 2021-09-19 LAB — VITAMIN B12: Vitamin B-12: 146 pg/mL — ABNORMAL LOW (ref 180–914)

## 2021-09-19 LAB — HIV ANTIBODY (ROUTINE TESTING W REFLEX): HIV Screen 4th Generation wRfx: NONREACTIVE

## 2021-09-19 LAB — FOLATE: Folate: 11.8 ng/mL (ref >5.9)

## 2021-09-19 LAB — FERRITIN: Ferritin: 2 ng/mL — ABNORMAL LOW (ref 24–336)

## 2021-09-19 SURGERY — CANCELLED PROCEDURE

## 2021-09-19 MED ORDER — SODIUM CHLORIDE 0.9 % IV SOLN: INTRAVENOUS | Status: DC

## 2021-09-19 MED ORDER — ONDANSETRON HCL 4 MG/2ML IJ SOLN: 4.0000 mg | Freq: Four times a day (QID) | INTRAMUSCULAR | Status: DC | PRN

## 2021-09-19 MED ORDER — SODIUM CHLORIDE 0.9% IV SOLUTION: Freq: Once | INTRAVENOUS | Status: AC

## 2021-09-19 MED ORDER — POLYETHYLENE GLYCOL 3350 17 G PO PACK: 17.0000 g | PACK | Freq: Every day | ORAL | Status: DC | PRN

## 2021-09-19 MED ORDER — LACTATED RINGERS IV SOLN
INTRAVENOUS | Status: AC | PRN
  Administered 2021-09-19: 20 mL/h via INTRAVENOUS

## 2021-09-19 MED ORDER — PANTOPRAZOLE SODIUM 40 MG IV SOLR
40.0000 mg | Freq: Two times a day (BID) | INTRAVENOUS | Status: DC
  Administered 2021-09-19 – 2021-09-22 (×7): 40 mg via INTRAVENOUS
  Filled 2021-09-19 (×7): qty 10

## 2021-09-19 MED ORDER — CYANOCOBALAMIN 1000 MCG/ML IJ SOLN
1000.0000 ug | INTRAMUSCULAR | Status: DC
  Administered 2021-09-22: 1000 ug via INTRAMUSCULAR
  Filled 2021-09-19 (×2): qty 1

## 2021-09-19 MED ORDER — ACETAMINOPHEN 325 MG PO TABS: 650.0000 mg | ORAL_TABLET | Freq: Four times a day (QID) | ORAL | Status: DC | PRN

## 2021-09-19 MED ORDER — ONDANSETRON HCL 4 MG PO TABS: 4.0000 mg | ORAL_TABLET | Freq: Four times a day (QID) | ORAL | Status: DC | PRN

## 2021-09-19 MED ORDER — PEG 3350-KCL-NA BICARB-NACL 420 G PO SOLR
4000.0000 mL | Freq: Once | ORAL | Status: AC
  Administered 2021-09-20: 4000 mL via ORAL
  Filled 2021-09-19: qty 4000

## 2021-09-19 MED ORDER — ACETAMINOPHEN 650 MG RE SUPP: 650.0000 mg | Freq: Four times a day (QID) | RECTAL | Status: DC | PRN

## 2021-09-19 MED ORDER — IOHEXOL 300 MG/ML  SOLN
100.0000 mL | Freq: Once | INTRAMUSCULAR | Status: AC | PRN
  Administered 2021-09-19: 100 mL via INTRAVENOUS

## 2021-09-19 SURGICAL SUPPLY — 15 items

## 2021-09-19 NOTE — Consult Note (Addendum)
[facility] Consult  Referring Provider: Dr. Charline Bills hospitalist Primary Care Physician:  Pcp, No Primary Gastroenterologist: Dr. Watt Climes  Reason for Consultation: Severe symptomatic anemia  HPI: Evan Brown is a 56 y.o. 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 Patient states he has not noted any blood in stool or black stools. Patient denies difficulty swallowing or pain on swallowing. He complains of mild epigastric discomfort. He has not noted bleeding from any other site. In the ER he was found to have a hemoglobin of 3.8. Patient has never had a colonoscopy in the past.  Patient smokes half a pack to 1 pack a day of cigarettes. He denies use of alcohol. He smokes crack cocaine, last used 2 to 3 days ago.   Past Medical History:  Diagnosis Date   Gastric cancer pT1pN0 (0/12LN) s/p subtotal gastrectomy/B2 reconstruction 03/19/2019 03/19/2019    Past Surgical History:  Procedure Laterality Date   GASTROSTOMY N/A 03/19/2019   Procedure: INSERTION OF JEJUNOSTOMY TUBE;  Surgeon: [name], MD;  Location: [place];  Service: General;  Laterality: N/A;   LAPAROSCOPY N/A 03/19/2019   Procedure: LAPAROSCOPY DIAGNOSTIC;  Surgeon: [name], MD;  Location: [place];  Service: General;  Laterality: N/A;   PARTIAL GASTRECTOMY N/A 03/19/2019   Procedure: PARTIAL GASTRECTOMY;  Surgeon: [name], MD;  Location: [place];  Service: General;  Laterality: N/A;    Prior to Admission medications   Medication Sig Start Date End Date Taking? Authorizing Provider  acetaminophen (TYLENOL) 500 MG tablet Take 500 mg by mouth every 6 (six) hours as needed for moderate pain.   Yes [provider]  feeding  supplement, ENSURE ENLIVE, (ENSURE ENLIVE) LIQD Take 237 mLs by mouth 3 (three) times daily between meals. Patient not taking: Reported on 09/19/2021 03/26/19   [name], MD  hydrocortisone (ANUSOL-HC) 2.5 % rectal cream Apply 1 application topically 4 (four) times daily as needed for hemorrhoids. Patient not taking: Reported on 09/19/2021 03/26/19   [name], MD  Nutritional Supplements (FEEDING SUPPLEMENT, OSMOLITE 1.5 CAL,) LIQD Place 1,000 mLs into feeding tube continuous. Patient not taking: Reported on 09/19/2021 03/26/19   [name], MD  omeprazole (PRILOSEC) 40 MG capsule Take 1 capsule (40 mg total) by mouth daily. Patient not taking: Reported on 09/19/2021 11/05/18   Petrucelli, Samantha R, PA-C  ondansetron (ZOFRAN ODT) 4 MG disintegrating tablet Take 1 tablet (4 mg total) by mouth every 8 (eight) hours as needed for nausea or vomiting. Patient not taking: Reported on 12/27/2018 11/05/18   Petrucelli, Aldona Bar R, PA-C  oxyCODONE (OXY IR/ROXICODONE) 5 MG immediate release tablet Take 1 tablet (5 mg total) by mouth every 6 (six) hours as needed for severe pain. Patient not taking: Reported on 09/19/2021 03/26/19   [name], MD  promethazine (PHENERGAN) 25 MG tablet Take 1 tablet (25 mg total) by mouth every 6 (six) hours as needed for nausea or vomiting. Patient not taking: Reported on 03/07/2019 12/27/18   Dalia Heading, PA-C  traMADol (ULTRAM) 50 MG tablet Take 1 tablet (50 mg total) by mouth every 6 (six) hours as needed for severe pain. Patient not taking: Reported on 03/07/2019 12/27/18   Dalia Heading, PA-C    Current Facility-Administered Medications  Medication Dose Route Frequency Provider  Last Rate Last Admin   0.9 %  sodium chloride infusion (Manually program via Guardrails IV Fluids)   Intravenous Once Shalhoub, Sherryll Burger, MD   Paused at 09/19/21 0000   0.9 %  sodium chloride infusion  10 mL/hr Intravenous Once Shalhoub, Sherryll Burger, MD   Paused at 09/19/21 0001    acetaminophen (TYLENOL) tablet 650 mg  650 mg Oral Q6H PRN Shalhoub, Sherryll Burger, MD       Or   acetaminophen (TYLENOL) suppository 650 mg  650 mg Rectal Q6H PRN Shalhoub, Sherryll Burger, MD       cyanocobalamin ((VITAMIN B-12)) injection 1,000 mcg  1,000 mcg Intramuscular Weekly Shalhoub, Sherryll Burger, MD       ondansetron [facility]) tablet 4 mg  4 mg Oral Q6H PRN Shalhoub, Sherryll Burger, MD       Or   ondansetron [facility]) injection 4 mg  4 mg Intravenous Q6H PRN Shalhoub, Sherryll Burger, MD       pantoprazole (PROTONIX) injection 40 mg  40 mg Intravenous Q12H Shalhoub, Sherryll Burger, MD   40 mg at 09/19/21 1142   polyethylene glycol (MIRALAX / GLYCOLAX) packet 17 g  17 g Oral Daily PRN Shalhoub, Sherryll Burger, MD       Current Outpatient Medications  Medication Sig Dispense Refill   acetaminophen (TYLENOL) 500 MG tablet Take 500 mg by mouth every 6 (six) hours as needed for moderate pain.     feeding supplement, ENSURE ENLIVE, (ENSURE ENLIVE) LIQD Take 237 mLs by mouth 3 (three) times daily between meals. (Patient not taking: Reported on 09/19/2021) 237 mL 12   hydrocortisone (ANUSOL-HC) 2.5 % rectal cream Apply 1 application topically 4 (four) times daily as needed for hemorrhoids. (Patient not taking: Reported on 09/19/2021) 30 g 0   Nutritional Supplements (FEEDING SUPPLEMENT, OSMOLITE 1.5 CAL,) LIQD Place 1,000 mLs into feeding tube continuous. (Patient not taking: Reported on 09/19/2021) 29400 mL 1   omeprazole (PRILOSEC) 40 MG capsule Take 1 capsule (40 mg total) by mouth daily. (Patient not taking: Reported on 09/19/2021) 30 capsule 0   ondansetron (ZOFRAN ODT) 4 MG disintegrating tablet Take 1 tablet (4 mg total) by mouth every 8 (eight) hours as needed for nausea or vomiting. (Patient not taking: Reported on 12/27/2018) 5 tablet 0   oxyCODONE (OXY IR/ROXICODONE) 5 MG immediate release tablet Take 1 tablet (5 mg total) by mouth every 6 (six) hours as needed for severe pain. (Patient not taking: Reported on 09/19/2021) 10 tablet 0    promethazine (PHENERGAN) 25 MG tablet Take 1 tablet (25 mg total) by mouth every 6 (six) hours as needed for nausea or vomiting. (Patient not taking: Reported on 03/07/2019) 10 tablet 0   traMADol (ULTRAM) 50 MG tablet Take 1 tablet (50 mg total) by mouth every 6 (six) hours as needed for severe pain. (Patient not taking: Reported on 03/07/2019) 15 tablet 0    Allergies as of 09/18/2021   (No Known Allergies)    Family History  Problem Relation Age of Onset   Heart disease Neg Hx     Social History   Socioeconomic History   Marital status: Single    Spouse name: Not on file   Number of children: Not on file   Years of education: Not on file   Highest education level: Not on file  Occupational History   Not on file  Tobacco Use   Smoking status: Every Day    Packs/day: 0.50    Types: Cigarettes  Smokeless tobacco: Never  Vaping Use   Vaping Use: Never used  Substance and Sexual Activity   Alcohol use: Not Currently   Drug use: Yes    Types: Marijuana    Comment: occassionally    Sexual activity: Not on file  Other Topics Concern   Not on file  Social History Narrative   Not on file   Social Determinants of Health   Financial Resource Strain: Not on file  Food Insecurity: Not on file  Transportation Needs: Not on file  Physical Activity: Not on file  Stress: Not on file  Social Connections: Not on file  Intimate Partner Violence: Not on file    Review of Systems: Positive for: GI: Described in detail in HPI.    Gen: fatigue, weakness, malaise, involuntary weight loss,  denies any fever, chills, rigors, night sweats, anorexia and sleep disorder CV: Denies chest pain, angina, palpitations, syncope, orthopnea, PND, peripheral edema, and claudication. Resp:  exertional dyspnea, denies cough, sputum, wheezing, coughing up blood. GU : Denies urinary burning, blood in urine, urinary frequency, urinary hesitancy, nocturnal urination, and urinary incontinence. MS:  Denies joint pain or swelling.  Denies muscle weakness, cramps, atrophy.  Derm: Denies rash, itching, oral ulcerations, hives, unhealing ulcers.  Psych: Denies depression, anxiety, memory loss, suicidal ideation, hallucinations,  and confusion. Heme: Denies bruising, bleeding, and enlarged lymph nodes. Neuro:  Denies any headaches, dizziness, paresthesias. Endo:  Denies any problems with DM, thyroid, adrenal function.  Physical Exam: Vital signs in last 24 hours: Temp:  [98.4 F (36.9 C)-99.5 F (37.5 C)] 98.7 F (37.1 C) (06/04 1140) Pulse Rate:  [72-94] 75 (06/04 1139) Resp:  [14-19] 19 (06/04 1139) BP: (117-151)/(62-94) 151/93 (06/04 1139) SpO2:  [93 %-100 %] 100 % (06/04 1139)    General:   Alert,  Well-developed, well-nourished, pleasant and cooperative in NAD Head:  Normocephalic and atraumatic. Eyes:  Sclera clear, no icterus.   Prominent pallor  Ears:  Normal auditory acuity. Nose:  No deformity, discharge,  or lesions. Mouth:  No deformity or lesions.  Oropharynx pink & moist. Neck:  Supple; no masses or thyromegaly. Lungs:  Clear throughout to auscultation.   No wheezes, crackles, or rhonchi. No acute distress. Heart:  Regular rate and rhythm; no murmurs, clicks, rubs,  or gallops. Extremities:  Without clubbing or edema. Neurologic:  Alert and  oriented x4;  grossly normal neurologically. Skin:  Intact without significant lesions or rashes. Psych:  Alert and cooperative. Normal mood and affect. Abdomen:  Soft, nontender and nondistended. No masses, hepatosplenomegaly or hernias noted. Normal bowel sounds, without guarding, and without rebound.         Lab Results: Recent Labs    09/18/21 2122  WBC 8.7  HGB 3.8*  HCT 15.3*  PLT 451*   BMET Recent Labs    09/18/21 2122  NA 140  K 4.0  CL 111  CO2 23  GLUCOSE 88  BUN 13  CREATININE 0.83  CALCIUM 8.8*   LFT Recent Labs    09/18/21 2122  PROT 6.5  ALBUMIN 3.4*  AST 13*  ALT 16  ALKPHOS 92   BILITOT 0.4   PT/INR No results for input(s): LABPROT, INR in the last 72 hours.  Studies/Results: CT ABDOMEN PELVIS W CONTRAST  Result Date: 09/19/2021 CLINICAL DATA:  Epigastric pain low hemoglobin EXAM: CT ABDOMEN AND PELVIS WITH CONTRAST TECHNIQUE: Multidetector CT imaging of the abdomen and pelvis was performed using the standard protocol following bolus administration of intravenous contrast.  RADIATION DOSE REDUCTION: This exam was performed according to the departmental dose-optimization program which includes automated exposure control, adjustment of the mA and/or kV according to patient size and/or use of iterative reconstruction technique. CONTRAST:  158m OMNIPAQUE IOHEXOL 300 MG/ML  SOLN COMPARISON:  CT 12/27/2018, 11/05/2018, 01/09/2016 FINDINGS: Lower chest: Lung bases demonstrate no acute consolidation or pleural effusion. Hepatobiliary: Small gallstone. No biliary dilatation. No focal hepatic abnormality. Pancreas: Unremarkable. No pancreatic ductal dilatation or surrounding inflammatory changes. Spleen: Normal in size without focal abnormality. Adrenals/Urinary Tract: Right adrenal gland is normal. 2.3 cm left adrenal mass stable compared with the previous exams and presumably representing an adenoma, no follow-up imaging is recommended. Kidneys show no hydronephrosis. The bladder is slightly thick walled Stomach/Bowel: Interval distal gastrectomy with gastrojejunostomy. No obstructive features. Questionable gastric wall thickening of the gastric remanent and at the anastomosis. No dilated small bowel. Negative appendix. No acute bowel wall thickening Vascular/Lymphatic: Mild aortic atherosclerosis. No aneurysm. No suspicious lymph nodes. Reproductive: Prostate is unremarkable. Other: Negative for pelvic effusion or free air. Musculoskeletal: No acute osseous abnormality IMPRESSION: 1. No CT evidence for acute intra-abdominal or pelvic abnormality. 2. Interval distal gastrectomy with  gastrojejunal anastomosis. Question mild wall thickening of the gastric remnant and at the anastomosis, this would be better evaluated with endoscopy 3. Small gallstone Electronically Signed   By: KDonavan FoilM.D.   On: 09/19/2021 03:35    Impression: Severe symptomatic anemia Hemoglobin 3.8, MCV 60.2, platelet 451, likely reactive thrombocytosis Iron saturation 2, ferritin 2, TIBC 624 compatible with severe iron deficiency anemia Low vitamin B12 FOBT negative CT: No acute intra-abdominal or pelvic pathology, interval distal gastrectomy with gastrojejunal anastomosis, mild wall thickening in gastric remnant, recommend EGD  Normal BUN/creatinine   Plan: Patient has received 4 unit PRBC transfusion, is hemodynamically stable. Plan EGD today if anesthesia is available, otherwise in a.m..Marland KitchenIf EGD is unremarkable, will need a colonoscopy thereafter, as patient has never had a colonoscopy before.  Addendum: Patient was initially scheduled for EGD today, however he had crackers at 10 AM. EGD today has been canceled. [facility] We will start patient on clear liquid diet, give colonic prep tomorrow, plan EGD and colonoscopy with Dr. MWatt Climeson Tuesday. This has been discussed with the patient, he verbalizes understanding.   LOS: 0 days   ARonnette Juniper MD  09/19/2021, 1:17 PM

## 2021-09-19 NOTE — Assessment & Plan Note (Signed)
   Patient presenting with several month history of progressively worsening generalized weakness and dyspnea on exertion likely secondary to slowly progressing anemia  Patient is presenting profound anemia, apparently microcytic on CBC however I believe that the anemia is likely multifactorial in origin.  Patient may be deficient in vitamin B12 considering history of partial gastrectomy and may even be deficient in iron  Obtaining vitamin B12, folate, iron panel  Transfusing patient with 4 units of packed red blood cells  We will then perform serial CBCs every 6 hours  Treating patient with intravenous Protonix  Keeping patient n.p.o. for now  Secure chat message sent to [facility] gastroenterology requesting their assistance in consultation for consideration of endoscopic work-up

## 2021-09-19 NOTE — ED Notes (Signed)
Patient transported to CT 

## 2021-09-19 NOTE — Assessment & Plan Note (Signed)
Patient is being counseled daily on smoking cessation.  Patient is declining nicotine replacement therapy during this hospital stay.

## 2021-09-19 NOTE — Plan of Care (Signed)

## 2021-09-19 NOTE — Assessment & Plan Note (Signed)
   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  We will need to ensure the patient has good follow-up with both gastroenterology and oncology at time of discharge.

## 2021-09-19 NOTE — ED Notes (Signed)
Pt drinking oral contrast for CT at this time

## 2021-09-19 NOTE — H&P (Signed)
History and Physical    Patient: Evan Brown MRN: [id number] DOA: 09/18/2021  Date of Service: the patient was seen and examined on 09/19/2021  Patient coming from: Home  Chief Complaint:  Chief Complaint  Patient presents with   Abdominal Pain    HPI:   56 year old male with past medical history of adenocarcinoma of the stomach (status post partial gastrectomy and omentectomy 03/2019), gastroesophageal reflux disease who presents to [facility] emergency department at the instruction of her primary care provider due to concerns over a low hemoglobin.  Patient explains that he has not followed up with any outpatient providers regularly since his diagnosis and resection of his gastric cancer in 2020.  Patient explains that over approximately the past 4 months he has developed gradually worsening generalized weakness.  Weakness was initially mild in intensity but now has become more more severe.  Lately, patient has developed associated shortness of breath, worse with exertion and improved with rest.  Patient denies any associated cough, fever peripheral edema with orthopnea or paroxysmal dyspnea or chest pain.  Upon further questioning patient additionally endorses a 20 pound unintentional weight loss over the same span of time.  Patient denies hematemesis melena or hematochezia.  Patient does complain of associated abdominal pain, epigastric in location, that occurs with PO intake limiting his ability to eat large meals.   Patient's progressively worsening symptoms prompted him to follow-up with a outpatient provider at [facility].  Patient was worked up including a CBC which revealed the patient to have an extremely low hemoglobin prompting the patient to be instructed to go to the emergency department for evaluation.  Upon evaluation in the emergency department patient was confirmed to have severe anemia with hemoglobin of 3.8.  Rectal exam revealed no evidence of gross  blood and stool Hemoccult was negative.  ER provider ordered 2 units of packed red blood cells and the hospitalist group was then called to assess the patient for admission the hospital.    Review of Systems: Review of Systems  Constitutional:  Positive for malaise/fatigue.  Respiratory:  Positive for shortness of breath.   Gastrointestinal:  Positive for abdominal pain.  Neurological:  Positive for weakness.    Past Medical History:  Diagnosis Date   Gastric cancer pT1pN0 (0/12LN) s/p subtotal gastrectomy/B2 reconstruction 03/19/2019 03/19/2019    Past Surgical History:  Procedure Laterality Date   GASTROSTOMY N/A 03/19/2019   Procedure: INSERTION OF JEJUNOSTOMY TUBE;  Surgeon: [name], MD;  Location: [facility];  Service: General;  Laterality: N/A;   LAPAROSCOPY N/A 03/19/2019   Procedure: LAPAROSCOPY DIAGNOSTIC;  Surgeon: [name], MD;  Location: [facility];  Service: General;  Laterality: N/A;   PARTIAL GASTRECTOMY N/A 03/19/2019   Procedure: PARTIAL GASTRECTOMY;  Surgeon: [name], MD;  Location: [facility];  Service: General;  Laterality: N/A;    Social History:  reports that he has been smoking cigarettes. He has been smoking an average of .5 packs per day. He has never used smokeless tobacco. He reports that he does not currently use alcohol. He reports current drug use. Drug: Marijuana.  No Known Allergies  Family History  Problem Relation Age of Onset   Heart disease Neg Hx     Prior to Admission medications   Medication Sig Start Date End Date Taking? Authorizing Provider  acetaminophen (TYLENOL) 500 MG tablet Take 500 mg by mouth every 6 (six) hours as needed for moderate pain.    [provider]  feeding  supplement, ENSURE ENLIVE, (ENSURE ENLIVE) LIQD Take 237 mLs by mouth 3 (three) times daily between meals. 03/26/19   [name], MD  hydrocortisone (ANUSOL-HC) 2.5 % rectal cream Apply 1 application topically 4 (four) times daily as needed for hemorrhoids.  03/26/19   [name], MD  Nutritional Supplements (FEEDING SUPPLEMENT, OSMOLITE 1.5 CAL,) LIQD Place 1,000 mLs into feeding tube continuous. 03/26/19   [name], MD  omeprazole (PRILOSEC) 40 MG capsule Take 1 capsule (40 mg total) by mouth daily. 11/05/18   Petrucelli, Samantha R, PA-C  ondansetron (ZOFRAN ODT) 4 MG disintegrating tablet Take 1 tablet (4 mg total) by mouth every 8 (eight) hours as needed for nausea or vomiting. Patient not taking: Reported on 12/27/2018 11/05/18   Petrucelli, Aldona Bar R, PA-C  oxyCODONE (OXY IR/ROXICODONE) 5 MG immediate release tablet Take 1 tablet (5 mg total) by mouth every 6 (six) hours as needed for severe pain. 03/26/19   [name], MD  promethazine (PHENERGAN) 25 MG tablet Take 1 tablet (25 mg total) by mouth every 6 (six) hours as needed for nausea or vomiting. Patient not taking: Reported on 03/07/2019 12/27/18   Dalia Heading, PA-C  traMADol (ULTRAM) 50 MG tablet Take 1 tablet (50 mg total) by mouth every 6 (six) hours as needed for severe pain. Patient not taking: Reported on 03/07/2019 12/27/18   Dalia Heading, PA-C    Physical Exam:  Vitals:   09/19/21 0601 09/19/21 0617 09/19/21 0756 09/19/21 0812  BP: 132/82 137/82 137/82 (!) 150/84  Pulse: 86 89 88 79  Resp: '16 18 15 14  '$ Temp: 98.6 F (37 C) 98.8 F (37.1 C) 99.2 F (37.3 C) 99.1 F (37.3 C)  TempSrc: Oral Oral    SpO2: 100% 100% 100% 99%     Constitutional: Awake alert and oriented x3, no associated distress.   Skin: no rashes, no lesions, good skin turgor noted. Eyes: Pupils are equally reactive to light.  Significant conjunctival pallor without scleral icterus.  ENMT: Moist mucous membranes noted.  Posterior pharynx clear of any exudate or lesions.   Neck: normal, supple, no masses, no thyromegaly.  No evidence of jugular venous distension.   Respiratory: clear to auscultation bilaterally, no wheezing, no crackles. Normal respiratory effort. No accessory muscle  use.  Cardiovascular: Regular rate and rhythm, no murmurs / rubs / gallops. No extremity edema. 2+ pedal pulses. No carotid bruits.  Chest:   Nontender without crepitus or deformity.   Back:   Nontender without crepitus or deformity. Abdomen: Epigastric tenderness.  Abdomen is soft however.   No evidence of intra-abdominal masses.  Positive bowel sounds noted in all quadrants.   Musculoskeletal: No joint deformity upper and lower extremities. Good ROM, no contractures. Normal muscle tone.  Neurologic: CN 2-12 grossly intact. Sensation intact.  Patient moving all 4 extremities spontaneously.  Patient is following all commands.  Patient is responsive to verbal stimuli.   Psychiatric: Patient exhibits normal mood with appropriate affect.  Patient seems to possess insight as to their current situation.    Data Reviewed:  I have personally reviewed and interpreted labs, imaging.  Significant findings are:  White blood cell count of 8.7, hemoglobin of 3.8, platelet count of 451.   Chemistry revealing sodium 140, potassium 4.0, BUN 13, creatinine 0.3  EKG: Personally reviewed.  Rhythm is NSR with heart rate of 81BPM.  No dynamic ST segment changes appreciated.    Assessment and Plan: * Symptomatic anemia Patient presenting with several month history of progressively worsening  generalized weakness and dyspnea on exertion likely secondary to slowly progressing anemia Patient is presenting profound anemia, apparently microcytic on CBC however I believe that the anemia is likely multifactorial in origin. Patient may be deficient in vitamin B12 considering history of partial gastrectomy and may even be deficient in iron Obtaining vitamin B12, folate, iron panel Transfusing patient with 4 units of packed red blood cells We will then perform serial CBCs every 6 hours Treating patient with intravenous Protonix Keeping patient n.p.o. 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 We will need to ensure the patient has good follow-up with both gastroenterology and oncology at time of discharge.  GERD (gastroesophageal reflux disease) Intravenous Protonix as noted above.  Nicotine dependence, cigarettes, uncomplicated Patient is being counseled daily on smoking cessation. Patient is declining nicotine replacement therapy during this hospital stay.       Code Status:  Full code  code status decision has been confirmed with: Patient Family Communication: Wife is at bedside who has been updated on plan of care.  Consults: I have sent a secure chat to Dr. Hilarie Fredrickson with [facility] gastroenterology requesting consultation.  Severity of Illness:  The appropriate patient status for this patient is OBSERVATION. Observation status is judged to be reasonable and necessary in order to provide the required intensity of service to ensure the patient's safety. The patient's presenting symptoms, physical exam findings, and initial radiographic and laboratory data in the context of their medical condition is felt to place them at decreased risk for further clinical deterioration. Furthermore, it is anticipated that the patient will be medically stable for discharge from the hospital within 2 midnights of admission.   Author:  Vernelle Emerald  MD  09/19/2021 9:05 AM

## 2021-09-19 NOTE — ED Notes (Signed)
Gave pt beef and chicken broth and a sprite

## 2021-09-19 NOTE — Progress Notes (Signed)
Pt in pre-procedure for EGD.  Pt originally stated he had been NPO since midnight but then remembered he actually ate some graham crackers at 1000 despite having an NPO order in place.  Pt to return to ED and will have procedure rescheduled.  Vista Lawman, RN

## 2021-09-19 NOTE — Assessment & Plan Note (Signed)
   Intravenous Protonix as noted above 

## 2021-09-19 NOTE — Progress Notes (Signed)
Pt admitted by Dr Cyd Silence earlier this am, please seen note for detailed H&P   56 year old male with past medical history of adenocarcinoma of the stomach (status post partial gastrectomy and omentectomy 03/2019), gastroesophageal reflux disease who presents to [facility] emergency department at the instruction of her primary care provider due or severe symptomatic anemia.   He was found to have hemoglobin of 3.8 . Pt was diagnosed with gastric cancer in 2020 FOBT is negative. Underwent 4 units of prbc transfusion , repeat hemoglobin is 7.8.  GI consulted, plan for EGD and colonoscopy on Tuesday.  Currently on clears.   Hosie Poisson, MD

## 2021-09-20 DIAGNOSIS — F17210 Nicotine dependence, cigarettes, uncomplicated: Secondary | ICD-10-CM | POA: Diagnosis not present

## 2021-09-20 DIAGNOSIS — Z85028 Personal history of other malignant neoplasm of stomach: Secondary | ICD-10-CM | POA: Diagnosis not present

## 2021-09-20 DIAGNOSIS — K219 Gastro-esophageal reflux disease without esophagitis: Secondary | ICD-10-CM | POA: Diagnosis not present

## 2021-09-20 DIAGNOSIS — D75839 Thrombocytosis, unspecified: Secondary | ICD-10-CM

## 2021-09-20 DIAGNOSIS — D649 Anemia, unspecified: Secondary | ICD-10-CM | POA: Diagnosis not present

## 2021-09-20 LAB — BPAM RBC
Blood Product Expiration Date: 202306202359
Blood Product Expiration Date: 202306242359
Blood Product Expiration Date: 202306242359
Blood Product Expiration Date: 202306242359
Blood Product Expiration Date: 202306242359
ISSUE DATE / TIME: 202306032338
ISSUE DATE / TIME: 202306040318
ISSUE DATE / TIME: 202306040552
ISSUE DATE / TIME: 202306040750
ISSUE DATE / TIME: 202306042233
Unit Type and Rh: 5100
Unit Type and Rh: 5100
Unit Type and Rh: 5100
Unit Type and Rh: 5100
Unit Type and Rh: 5100

## 2021-09-20 LAB — TYPE AND SCREEN
ABO/RH(D): O POS
Antibody Screen: NEGATIVE
Unit division: 0
Unit division: 0
Unit division: 0
Unit division: 0
Unit division: 0

## 2021-09-20 LAB — HEMOGLOBIN AND HEMATOCRIT, BLOOD
HCT: 24.8 % — ABNORMAL LOW (ref 39.0–52.0)
Hemoglobin: 7.6 g/dL — ABNORMAL LOW (ref 13.0–17.0)

## 2021-09-20 LAB — PROTIME-INR
INR: 1.2 (ref 0.8–1.2)
Prothrombin Time: 14.9 seconds (ref 11.4–15.2)

## 2021-09-20 MED ORDER — SODIUM CHLORIDE 0.9 % IV SOLN
INTRAVENOUS | Status: DC
Start: 2021-09-20 — End: 2021-09-21

## 2021-09-20 NOTE — Progress Notes (Signed)
Patient received one unit of PRBC. No adverse reactions. Patient is stable and resting.

## 2021-09-20 NOTE — Progress Notes (Addendum)
PT Cancellation Note and Discharge  Patient Details Name: Evan Brown MRN: [id number] DOB: December 17, 1965   Cancelled Treatment:    Reason Eval/Treat Not Completed: PT screened, no needs identified, will sign off. Pt somewhat resistant to PT evaluation, stating "I'm good" over and over throughout the encounter and in response to reasoning for PT consult. Pt appears somewhat frustrated, stands up and demonstrates high marches and 360 turns to "prove" he does not require PT services. Will sign off. If needs change please reconsult.    Thelma Comp 09/20/2021, 3:10 PM  Rolinda Roan, PT, DPT Acute Rehabilitation Services Secure Chat Preferred Office: [phone]

## 2021-09-20 NOTE — Progress Notes (Signed)
[facility] Gastroenterology Progress Note  Evan Brown 56 y.o. 1966/01/09  CC:  Symptomatic anemia   Subjective: Patient states he feels fine today.  Denies nausea, vomiting, fever, chills, abdominal pain.  Denies overt signs of GI bleeding including blood in stool or black stools.  Denies dysphagia.   ROS : Review of Systems  Constitutional:  Negative for chills and fever.  Gastrointestinal:  Negative for abdominal pain, blood in stool, constipation, diarrhea, heartburn, melena, nausea and vomiting.  Genitourinary:  Negative for dysuria and urgency.  Musculoskeletal:  Negative for falls.     Objective: Vital signs in last 24 hours: Vitals:   09/20/21 0433 09/20/21 0731  BP: 133/81   Pulse: 78   Resp: 19   Temp: 98.4 F (36.9 C) 98.1 F (36.7 C)  SpO2: 100%     Physical Exam:  General:  Alert, cooperative, no distress, appears stated age  Head:  Normocephalic, without obvious abnormality, atraumatic  Eyes:  Conjunctival pallor, no scleral icterus, EOMs intact  Lungs:   Clear to auscultation bilaterally, respirations unlabored  Heart:  Regular rate and rhythm, S1, S2 normal  Abdomen:   Soft, non-tender, bowel sounds active all four quadrants,  no masses,   Extremities: Extremities normal, atraumatic, no  edema       Lab Results: Recent Labs    09/18/21 2122 09/19/21 1403 09/19/21 1439  NA 140 139 136  K 4.0 4.3 4.3  CL 111 105 110  CO2 23  --  22  GLUCOSE 88 130* 79  BUN '13 9 9  '$ CREATININE 0.83 0.70 0.73  CALCIUM 8.8*  --  8.1*  MG  --   --  2.2   Recent Labs    09/18/21 2122 09/19/21 1439  AST 13* 13*  ALT 16 13  ALKPHOS 92 74  BILITOT 0.4 1.3*  PROT 6.5 5.8*  ALBUMIN 3.4* 3.0*   Recent Labs    09/19/21 1439 09/19/21 1831 09/20/21 0438  WBC 8.7 9.4  --   HGB 6.6* 6.9* 7.6*  HCT 22.4* 23.1* 24.8*  MCV 68.1* 67.7*  --   PLT 407* 420*  --    Recent Labs    09/19/21 2139 09/20/21 0438  LABPROT 14.9 14.9  INR 1.2 1.2      Assessment Severe symptomatic anemia - Hgb 3.8 on admission, s/p 4 units PRBCs, now up to 7.6 - MCV 67.7, BUN 9, Cr 0.73, no leukocytosis - Iron 11, ferritin 2, TIBC 624 consistent with iron deficiency anemia.  - CT A/P 09/19/2021 shows no CT evidence of acute intra-abdominal or pelvic abnormality.  Interval distal gastrectomy with gastrojejunal anastomosis.  Question mild wall thickening of the gastric remnant, recommend EGD.  Small gallstone.  Patient has history of gastric cancer and partial gastrectomy 2020.  Plan: Plan for EGD/Colonoscopy tomorrow. I thoroughly discussed the procedures to include nature, alternatives, benefits, and risks including but not limited to bleeding, perforation, infection, anesthesia/cardiac and pulmonary complications. Patient provides understanding and gave verbal consent to proceed. Continue Protonix IV '40mg'$  BID Continue clear liquid diet Nulytely prep today. NPO at midnight Continue supportive care as needed. [facility] will follow.     Angelique Holm PA-C 09/20/2021, 2:22 PM  Contact #  [phone]

## 2021-09-20 NOTE — Plan of Care (Signed)
  Problem: Education: Goal: Knowledge of General Education information will improve Description Including pain rating scale, medication(s)/side effects and non-pharmacologic comfort measures Outcome: Progressing   Problem: Health Behavior/Discharge Planning: Goal: Ability to manage health-related needs will improve Outcome: Progressing   

## 2021-09-20 NOTE — Progress Notes (Signed)
PT Cancellation Note  Patient Details Name: Evan Brown MRN: [id number] DOB: Sep 24, 1965   Cancelled Treatment:    Reason Eval/Treat Not Completed: Patient declined, no reason specified Pt eating breakfast at [facility] upon arrival, states he has been getting up and walking in the room and declines mobilizing with therapy at this time. Will check back as time allows.   Mackie Pai, SPT Acute Rehabilitation Services  Office: [phone]  Mackie Pai 09/20/2021, 9:14 AM

## 2021-09-20 NOTE — Progress Notes (Signed)
Evan Brown 2:30 PM  Subjective: Patient seen and examined in hospital computer chart in our office computer chart reviewed and case discussed with my partner Dr. Therisa Doyne and our PA and he has not seen any blood at home but has not had any follow-up since his surgery and we discussed the difficulty in absorbing B12 and iron with the type of surgery he had and we talked about how he may need injections of both but at least frequent blood counts and other labs to keep up with that and other than some constipation he has not had any other GI issues and has no other complaints and denies any aspirin or nonsteroidals at home and knows just to use Tylenol only  Objective: Vital signs stable afebrile no acute distress abdomen is soft nontender labs reviewed iron deficient and B12 deficient BUN and creatinine and liver tests okay CT okay  Assessment: Iron deficiency probably due to gastric surgery  Plan: We rediscussed endoscopy and colonoscopy and the prep and will proceed tomorrow morning with further work-up and plans pending those findings  [facility] E  office [phone] After 5PM or if no answer call [phone]

## 2021-09-20 NOTE — Progress Notes (Addendum)
[facility]                                                                               Evan Brown, is a 56 y.o. 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    He was found to have hemoglobin of 3.8 . Pt was diagnosed with gastric cancer in 2020 FOBT is negative. Underwent  a total of 5 units of prbc transfusion , repeat hemoglobin is 7.6   GI consulted, plan for EGD and colonoscopy on Tuesday.  Currently on clears.   Assessment & Plan    Assessment and Plan: * Symptomatic anemia/ microcytic anemia/ vit B12 deficiency.  Patient presenting with several month history of progressively worsening generalized weakness and dyspnea on exertion Patient underwent partial gastrectomy for adenocarcinoma of the stomach in 2020, never followed up after.  GI consulted and he will be scheduled for EGD and colonoscopy in am.  Underwent 5 units of prbc transfusion since admission.  Transfusion to keep hemoglobin greater than 7.  Anemia panel revealed iron deficiency anemia and vitamin b12 deficiency.  Continue with PPI IV BID.  FOBT Is negative.  CT abdomen and pelvis shows No CT evidence for acute intra-abdominal or pelvic abnormality. Interval distal gastrectomy with gastrojejunal anastomosis. Question mild wall thickening of the gastric remnant and at the anastomosis.  GERD (gastroesophageal reflux disease) Intravenous Protonix as noted above.  Nicotine dependence, cigarettes, uncomplicated Patient is being counseled daily on smoking cessation.   Thrombocytosis:   Probably reactive.   H/o gastric cancer:   s/p subtotal gastrectomy/B2 reconstruction 03/19/2019 by Dr Barry Dienes.  Recommend follow up with oncology after discharge.    Estimated body mass index is 20.72 kg/m as calculated from the following:   Height as of this encounter: '6\' 3"'$  (1.905 m).   Weight as of this encounter: 75.2 kg.  Code Status: full cod.e  DVT Prophylaxis:  SCDs Start: 09/19/21 0254   Level of Care: Level of care: Telemetry Medical Family Communication: none at bedside.   Disposition Plan:     Remains inpatient appropriate:  SEVERE SYMPTOMATIC ANEMIA  Procedures:  EGD AND COLONOSCOPY SCHEDULED TOMORROW.   Consultants:   GI   Antimicrobials:   Anti-infectives (From admission, onward)    None        Medications  Scheduled Meds:  sodium chloride   Intravenous Once   cyanocobalamin  1,000 mcg Intramuscular Weekly   pantoprazole (PROTONIX) IV  40 mg Intravenous Q12H   polyethylene glycol-electrolytes  4,000 mL Oral Once   Continuous Infusions:  sodium chloride Stopped (09/19/21 0001)   PRN Meds:.acetaminophen **OR** acetaminophen, ondansetron **OR** ondansetron (ZOFRAN) IV, polyethylene glycol    Subjective:   Evan Brown was seen and examined today. No nausea, vomiting or abdominal pain.  Objective:   Vitals:   09/19/21 2256 09/20/21 0130 09/20/21 0433 09/20/21 0731  BP: 130/81 (!) 147/83 133/81   Pulse: 85 69 78   Resp: '15 16 19   '$ Temp: 99.2 F (37.3 C) 98.4 F (36.9 C) 98.4 F (36.9 C) 98.1 F (36.7 C)  TempSrc:  Oral Oral Oral  SpO2:  100% 100%   Weight:      Height:        Intake/Output Summary (Last 24 hours) at 09/20/2021 1026 Last data filed at 09/20/2021 0400 Gross per 24 hour  Intake 836 ml  Output 400 ml  Net 436 ml   Filed Weights   09/19/21 1400 09/19/21 1801  Weight: 78 kg 75.2 kg     Exam General exam: Appears calm and comfortable  Respiratory system: Clear to auscultation. Respiratory effort  normal. Cardiovascular system: S1 & S2 heard, RRR. No JVD, murmurs, rubs, gallops or clicks. No pedal edema. Gastrointestinal system: Abdomen is nondistended, soft and nontender. No organomegaly or masses felt. Normal bowel sounds heard. Central nervous system: Alert and oriented. No focal neurological deficits. Extremities: Symmetric 5 x 5 power. Skin: No rashes, lesions or ulcers Psychiatry: Judgement and insight appear normal. Mood & affect appropriate.     Data Reviewed:  I have personally reviewed following labs and imaging studies   CBC Lab Results  Component Value Date   WBC 9.4 09/19/2021   RBC 3.41 (L) 09/19/2021   HGB 7.6 (L) 09/20/2021   HCT 24.8 (L) 09/20/2021   MCV 67.7 (L) 09/19/2021   MCH 20.2 (L) 09/19/2021   PLT 420 (H) 09/19/2021   MCHC 29.9 (L) 09/19/2021   RDW 27.1 (H) 09/19/2021   LYMPHSABS 3.0 03/12/2019   MONOABS 0.6 03/12/2019   EOSABS 0.2 03/12/2019   BASOSABS 0.1 18/84/1660     Last metabolic panel Lab Results  Component Value Date   NA 136 09/19/2021   K 4.3 09/19/2021   CL 110 09/19/2021   CO2 22 09/19/2021   BUN 9 09/19/2021   CREATININE 0.73 09/19/2021   GLUCOSE 79 09/19/2021   GFRNONAA >60 09/19/2021   GFRAA >60 03/24/2019   CALCIUM 8.1 (L) 09/19/2021   PHOS 2.2 (L) 03/22/2019   PROT 5.8 (L) 09/19/2021   ALBUMIN 3.0 (L) 09/19/2021   BILITOT 1.3 (H) 09/19/2021   ALKPHOS 74 09/19/2021   AST 13 (L) 09/19/2021   ALT 13 09/19/2021   ANIONGAP 4 (L) 09/19/2021    CBG (last 3)  No results for input(s): GLUCAP in the last 72 hours.    Coagulation Profile: Recent Labs  Lab 09/19/21 1439 09/19/21 2139 09/20/21 0438  INR 1.1 1.2 1.2     Radiology Studies: CT ABDOMEN PELVIS W CONTRAST  Result Date: 09/19/2021 CLINICAL DATA:  Epigastric pain low hemoglobin EXAM: CT ABDOMEN AND PELVIS WITH CONTRAST TECHNIQUE: Multidetector CT imaging of the abdomen and pelvis was performed using the standard protocol following bolus administration  of intravenous contrast. RADIATION DOSE REDUCTION: This exam was performed according to the departmental dose-optimization program which includes automated exposure control, adjustment of the mA and/or kV according to patient size and/or use of iterative reconstruction technique. CONTRAST:  169m OMNIPAQUE IOHEXOL 300 MG/ML  SOLN COMPARISON:  CT 12/27/2018, 11/05/2018, 01/09/2016 FINDINGS: Lower chest: Lung bases demonstrate no acute consolidation or pleural effusion. Hepatobiliary: Small gallstone. No biliary dilatation. No focal hepatic abnormality. Pancreas: Unremarkable. No pancreatic ductal dilatation or surrounding inflammatory changes. Spleen: Normal in size without focal abnormality. Adrenals/Urinary Tract: Right adrenal gland  is normal. 2.3 cm left adrenal mass stable compared with the previous exams and presumably representing an adenoma, no follow-up imaging is recommended. Kidneys show no hydronephrosis. The bladder is slightly thick walled Stomach/Bowel: Interval distal gastrectomy with gastrojejunostomy. No obstructive features. Questionable gastric wall thickening of the gastric remanent and at the anastomosis. No dilated small bowel. Negative appendix. No acute bowel wall thickening Vascular/Lymphatic: Mild aortic atherosclerosis. No aneurysm. No suspicious lymph nodes. Reproductive: Prostate is unremarkable. Other: Negative for pelvic effusion or free air. Musculoskeletal: No acute osseous abnormality IMPRESSION: 1. No CT evidence for acute intra-abdominal or pelvic abnormality. 2. Interval distal gastrectomy with gastrojejunal anastomosis. Question mild wall thickening of the gastric remnant and at the anastomosis, this would be better evaluated with endoscopy 3. Small gallstone Electronically Signed   By: Donavan Foil M.D.   On: 09/19/2021 03:35       Hosie Poisson M.D. [facility] 09/20/2021, 10:26 AM  Available via Epic secure chat 7am-7pm After 7 pm, please refer to night coverage  provider listed on amion.

## 2021-09-21 ENCOUNTER — Encounter (HOSPITAL_COMMUNITY)
Admission: EM | Disposition: A | Discharge status: Home / Self Care | Payer: Self-pay | Source: Home / Self Care | Attending: Internal Medicine

## 2021-09-21 ENCOUNTER — Encounter (HOSPITAL_COMMUNITY): Payer: Self-pay | Admitting: Internal Medicine

## 2021-09-21 ENCOUNTER — Inpatient Hospital Stay (HOSPITAL_COMMUNITY): Payer: Medicaid Other | Admitting: Anesthesiology

## 2021-09-21 DIAGNOSIS — Z85028 Personal history of other malignant neoplasm of stomach: Secondary | ICD-10-CM | POA: Diagnosis not present

## 2021-09-21 DIAGNOSIS — K648 Other hemorrhoids: Secondary | ICD-10-CM

## 2021-09-21 DIAGNOSIS — K449 Diaphragmatic hernia without obstruction or gangrene: Secondary | ICD-10-CM

## 2021-09-21 DIAGNOSIS — K219 Gastro-esophageal reflux disease without esophagitis: Secondary | ICD-10-CM | POA: Diagnosis not present

## 2021-09-21 DIAGNOSIS — D509 Iron deficiency anemia, unspecified: Secondary | ICD-10-CM

## 2021-09-21 DIAGNOSIS — D649 Anemia, unspecified: Secondary | ICD-10-CM | POA: Diagnosis not present

## 2021-09-21 DIAGNOSIS — K2950 Unspecified chronic gastritis without bleeding: Secondary | ICD-10-CM

## 2021-09-21 DIAGNOSIS — F17210 Nicotine dependence, cigarettes, uncomplicated: Secondary | ICD-10-CM | POA: Diagnosis not present

## 2021-09-21 HISTORY — PX: ESOPHAGOGASTRODUODENOSCOPY (EGD) WITH PROPOFOL: SHX5813

## 2021-09-21 HISTORY — PX: FLEXIBLE SIGMOIDOSCOPY: SHX5431

## 2021-09-21 LAB — CBC WITH DIFFERENTIAL/PLATELET
Abs Immature Granulocytes: 0.03 10*3/uL (ref 0.00–0.07)
Basophils Absolute: 0.1 10*3/uL (ref 0.0–0.1)
Basophils Relative: 1 %
Eosinophils Absolute: 0.2 10*3/uL (ref 0.0–0.5)
Eosinophils Relative: 2 %
HCT: 27 % — ABNORMAL LOW (ref 39.0–52.0)
Hemoglobin: 7.8 g/dL — ABNORMAL LOW (ref 13.0–17.0)
Immature Granulocytes: 0 %
Lymphocytes Relative: 28 %
Lymphs Abs: 2.4 10*3/uL (ref 0.7–4.0)
MCH: 20.5 pg — ABNORMAL LOW (ref 26.0–34.0)
MCHC: 28.9 g/dL — ABNORMAL LOW (ref 30.0–36.0)
MCV: 70.9 fL — ABNORMAL LOW (ref 80.0–100.0)
Monocytes Absolute: 0.7 10*3/uL (ref 0.1–1.0)
Monocytes Relative: 7 %
Neutro Abs: 5.4 10*3/uL (ref 1.7–7.7)
Neutrophils Relative %: 62 %
Platelets: 498 10*3/uL — ABNORMAL HIGH (ref 150–400)
RBC: 3.81 MIL/uL — ABNORMAL LOW (ref 4.22–5.81)
RDW: 28.4 % — ABNORMAL HIGH (ref 11.5–15.5)
WBC: 8.8 10*3/uL (ref 4.0–10.5)
nRBC: 0 % (ref 0.0–0.2)

## 2021-09-21 LAB — BASIC METABOLIC PANEL
Anion gap: 4 — ABNORMAL LOW (ref 5–15)
BUN: 8 mg/dL (ref 6–20)
CO2: 21 mmol/L — ABNORMAL LOW (ref 22–32)
Calcium: 8.7 mg/dL — ABNORMAL LOW (ref 8.9–10.3)
Chloride: 113 mmol/L — ABNORMAL HIGH (ref 98–111)
Creatinine, Ser: 0.75 mg/dL (ref 0.61–1.24)
GFR, Estimated: >60 mL/min (ref >60)
Glucose, Bld: 89 mg/dL (ref 70–99)
Potassium: 4.4 mmol/L (ref 3.5–5.1)
Sodium: 138 mmol/L (ref 135–145)

## 2021-09-21 SURGERY — ESOPHAGOGASTRODUODENOSCOPY (EGD) WITH PROPOFOL
Anesthesia: Monitor Anesthesia Care

## 2021-09-21 MED ORDER — PEG 3350-KCL-NA BICARB-NACL 420 G PO SOLR
4000.0000 mL | Freq: Once | ORAL | Status: AC
Start: 2021-09-21 — End: 2021-09-21
  Administered 2021-09-21: 4000 mL via ORAL
  Filled 2021-09-21: qty 4000

## 2021-09-21 MED ORDER — PROPOFOL 500 MG/50ML IV EMUL
INTRAVENOUS | Status: DC | PRN
  Administered 2021-09-21: 115 ug/kg/min via INTRAVENOUS

## 2021-09-21 MED ORDER — LIDOCAINE 2% (20 MG/ML) 5 ML SYRINGE
INTRAMUSCULAR | Status: DC | PRN
  Administered 2021-09-21: 60 mg via INTRAVENOUS

## 2021-09-21 MED ORDER — PROPOFOL 10 MG/ML IV BOLUS
INTRAVENOUS | Status: DC | PRN
  Administered 2021-09-21: 20 mg via INTRAVENOUS
  Administered 2021-09-21: 25 mg via INTRAVENOUS

## 2021-09-21 MED ORDER — SODIUM CHLORIDE 0.9 % IV SOLN
25.0000 mg | Freq: Once | INTRAVENOUS | Status: AC
  Administered 2021-09-21: 25 mg via INTRAVENOUS
  Filled 2021-09-21: qty 0.5

## 2021-09-21 MED ORDER — SODIUM CHLORIDE 0.9 % IV SOLN
500.0000 mg | Freq: Once | INTRAVENOUS | Status: AC
  Administered 2021-09-22: 500 mg via INTRAVENOUS
  Filled 2021-09-21 (×4): qty 10

## 2021-09-21 MED ORDER — LACTATED RINGERS IV SOLN
INTRAVENOUS | Status: AC | PRN
  Administered 2021-09-21: 1000 mL via INTRAVENOUS

## 2021-09-21 SURGICAL SUPPLY — 15 items

## 2021-09-21 NOTE — Progress Notes (Signed)
Evan Brown 9:02 AM  Subjective: Patient did not finish his prep but says his stools are liquid and we discussed how we may have to use more prep and recheck tomorrow but he has no other complaints and no signs of bleeding  Objective: Vital signs stable afebrile no acute distress exam please see preassessment evaluation labs okay  Assessment: Iron deficiency B12 deficiency probably secondary to previous gastric surgery  Plan: Okay to proceed with endoscopy and attempt at colonoscopy with anesthesia assistance believe patient may need periodic IV iron and monthly B12 injections at hematologist as an outpatient  [facility]  office [phone] After 5PM or if no answer call [phone]

## 2021-09-21 NOTE — Anesthesia Preprocedure Evaluation (Signed)
Anesthesia Evaluation  Patient identified by MRN, date of birth, ID band Patient awake    Reviewed: Allergy & Precautions, NPO status , Patient's Chart, lab work & pertinent test results  Airway Mallampati: II  TM Distance: >3 FB     Dental   Pulmonary Current Smoker and Patient abstained from smoking.,    breath sounds clear to auscultation       Cardiovascular negative cardio ROS   Rhythm:Regular Rate:Normal     Neuro/Psych    GI/Hepatic Neg liver ROS, GERD  ,  Endo/Other  negative endocrine ROS  Renal/GU      Musculoskeletal   Abdominal   Peds  Hematology   Anesthesia Other Findings   Reproductive/Obstetrics                             Anesthesia Physical Anesthesia Plan  ASA: 3  Anesthesia Plan: MAC   Post-op Pain Management:    Induction: Intravenous  PONV Risk Score and Plan: Treatment may vary due to age or medical condition and Propofol infusion  Airway Management Planned: Nasal Cannula and Simple Face Mask  Additional Equipment:   Intra-op Plan:   Post-operative Plan:   Informed Consent: I have reviewed the patients History and Physical, chart, labs and discussed the procedure including the risks, benefits and alternatives for the proposed anesthesia with the patient or authorized representative who has indicated his/her understanding and acceptance.     Dental advisory given  Plan Discussed with: CRNA and Anesthesiologist  Anesthesia Plan Comments:         Anesthesia Quick Evaluation

## 2021-09-21 NOTE — Op Note (Signed)
[facility] Patient Name: Evan Brown Procedure Date : 09/21/2021 MRN: [id number] Attending MD: Clarene Essex , MD Date of Birth: 1965-09-13 CSN: [id number] Age: 56 Admit Type: Inpatient Procedure:                Upper GI endoscopy Indications:              Iron deficiency anemia Providers:                Clarene Essex, MD, Carmie End, RN, William Dalton, Technician, Darliss Cheney, Technician Referring MD:              Medicines:                Monitored Anesthesia Care Complications:            No immediate complications. Estimated Blood Loss:     Estimated blood loss: none. Procedure:                Pre-Anesthesia Assessment:                           - Prior to the procedure, a History and Physical                            was performed, and patient medications and                            allergies were reviewed. The patient's tolerance of                            previous anesthesia was also reviewed. The risks                            and benefits of the procedure and the sedation                            options and risks were discussed with the patient.                            All questions were answered, and informed consent                            was obtained. Prior Anticoagulants: The patient has                            taken no previous anticoagulant or antiplatelet                            agents. ASA Grade Assessment: II - A patient with                            mild systemic disease. After reviewing the risks  and benefits, the patient was deemed in                            satisfactory condition to undergo the procedure.                           After obtaining informed consent, the endoscope was                            passed under direct vision. Throughout the                            procedure, the patient's blood pressure, pulse, and                            oxygen  saturations were monitored continuously. The                            [id number]-H190 ([id number] Olympus endoscope was introduced                            through the mouth, and advanced to the afferent and                            efferent jejunal loops. The upper GI endoscopy was                            accomplished without difficulty. The patient                            tolerated the procedure well. Scope In: Scope Out: Findings:      The larynx was normal.      A tiny hiatal hernia was present.      Diffuse minimal inflammation characterized by erythema and friability       was found in the entire examined stomach.      Evidence of a patent Billroth II gastrojejunostomy was found. The       gastrojejunal anastomosis was characterized by erythema and friable       mucosa. This was traversed. The efferent limb was examined. The afferent       limb was examined.      The exam was otherwise without abnormality. Impression:               - Normal larynx.                           - Tiny hiatal hernia.                           - Chronic gastritis.                           - Patent Billroth II gastrojejunostomy was found,                            characterized by erythema and friable  mucosa.                           - The examination was otherwise normal.                           - No specimens collected. Recommendation:           - Clear liquid diet today. For colonoscopy reprep                            tomorrow                           - Continue present medications. He will need heme                            consult for outpatient IV iron and B12 injections                           - Return to GI clinic PRN. Avoid aspirin and                            nonsteroidals going forward                           - Telephone GI clinic if symptomatic PRN.                           - Perform a colonoscopy tomorrow. See flex sig                            report for details of  poor prep Procedure Code(s):        --- Professional ---                           [phone], Esophagogastroduodenoscopy, flexible,                            transoral; diagnostic, including collection of                            specimen(s) by brushing or washing, when performed                            (separate procedure) Diagnosis Code(s):        --- Professional ---                           K44.9, Diaphragmatic hernia without obstruction or                            gangrene                           K29.50, Unspecified chronic gastritis without  bleeding                           Z98.0, Intestinal bypass and anastomosis status                           D50.9, Iron deficiency anemia, unspecified CPT copyright 2019 American Medical Association. All rights reserved. The codes documented in this report are preliminary and upon coder review may  be revised to meet current compliance requirements. Clarene Essex, MD 09/21/2021 9:45:43 AM This report has been signed electronically. Number of Addenda: 0

## 2021-09-21 NOTE — Transfer of Care (Signed)
Immediate Anesthesia Transfer of Care Note  Patient: Evan Brown  Procedure(s) Performed: ESOPHAGOGASTRODUODENOSCOPY (EGD) WITH PROPOFOL FLEXIBLE SIGMOIDOSCOPY  Patient Location: PACU  Anesthesia Type:MAC  Level of Consciousness: awake, alert  and drowsy  Airway & Oxygen Therapy: Patient Spontanous Breathing  Post-op Assessment: Report given to RN, Post -op Vital signs reviewed and stable and Patient moving all extremities X 4  Post vital signs: Reviewed and stable  Last Vitals:  Vitals Value Taken Time  BP    Temp    Pulse    Resp 16 09/21/21 0941  SpO2    Vitals shown include unvalidated device data.  Last Pain:  Vitals:   09/21/21 0820  TempSrc: Temporal  PainSc: 0-No pain         Complications: No notable events documented.

## 2021-09-21 NOTE — Anesthesia Procedure Notes (Signed)
Procedure Name: MAC Date/Time: 09/21/2021 9:07 AM Performed by: Rande Brunt, CRNA Pre-anesthesia Checklist: Patient identified, Emergency Drugs available, Suction available and Patient being monitored Patient Re-evaluated:Patient Re-evaluated prior to induction Oxygen Delivery Method: Nasal cannula Preoxygenation: Pre-oxygenation with 100% oxygen Induction Type: IV induction Airway Equipment and Method: Bite block Placement Confirmation: positive ETCO2 and CO2 detector Dental Injury: Teeth and Oropharynx as per pre-operative assessment

## 2021-09-21 NOTE — Plan of Care (Signed)

## 2021-09-21 NOTE — Op Note (Signed)
[facility] Patient Name: Evan Brown Procedure Date : 09/21/2021 MRN: [id number] Attending MD: Clarene Essex , MD Date of Birth: 20-Feb-1966 CSN: [id number] Age: 56 Admit Type: Inpatient Procedure:                Flexible Sigmoidoscopy only was trying to do                            colonoscopy Indications:              Iron deficiency anemia in a patient without                            previous colonic screening Providers:                Clarene Essex, MD, Carmie End, RN, William Dalton, Technician, Darliss Cheney, Technician Referring MD:              Medicines:                Monitored Anesthesia Care Complications:            No immediate complications. Estimated Blood Loss:     Estimated blood loss: none. Procedure:                Pre-Anesthesia Assessment:                           - Prior to the procedure, a History and Physical                            was performed, and patient medications and                            allergies were reviewed. The patient's tolerance of                            previous anesthesia was also reviewed. The risks                            and benefits of the procedure and the sedation                            options and risks were discussed with the patient.                            All questions were answered, and informed consent                            was obtained. Prior Anticoagulants: The patient has                            taken no previous anticoagulant or antiplatelet  agents. ASA Grade Assessment: II - A patient with                            mild systemic disease. After reviewing the risks                            and benefits, the patient was deemed in                            satisfactory condition to undergo the procedure.                           After obtaining informed consent, the scope was                            passed under direct  vision. The PCF-190TL ([id number]                            Olympus colonoscope was introduced through the anus                            and advanced to the the splenic flexure. After                            obtaining informed consent, the scope was passed                            under direct vision. The flexible sigmoidoscopy was                            technically difficult and complex due to poor bowel                            prep. Successful completion of the procedure was                            aided by lavage. The patient tolerated the                            procedure well. The quality of the bowel                            preparation was inadequate. Scope In: 9:25:07 AM Scope Out: 9:34:45 AM Total Procedure Duration: 0 hours 9 minutes 38 seconds  Findings:      Internal hemorrhoids were found during retroflexion, during perianal       exam and during digital exam. The hemorrhoids were small.      A large amount of stool was found in the rectum, in the sigmoid colon,       in the descending colon and at the splenic flexure, interfering with       visualization. Lavage of the area was performed using a moderate amount       of sterile water, resulting in incomplete clearance with continued poor  visualization. The prep made advancing to difficult and we elected to       stop the procedure      The exam was otherwise without abnormality. Impression:               - Preparation of the colon was inadequate.                           - Internal hemorrhoids.                           - Stool in the rectum, in the sigmoid colon, in the                            descending colon and at the splenic flexure.                           - The examination was otherwise normal.                           - No specimens collected. Recommendation:           - Clear liquid diet today. Reprep today for repeat                            colonoscopy attempt tomorrow                            - Continue present medications.                           - Return to GI clinic PRN. See endoscopy dictation                            for other recommendations                           - Telephone GI clinic if symptomatic PRN.                           - Repeat colonoscopy tomorrow because the                            examination was incomplete. Procedure Code(s):        --- Professional ---                           [id number], Sigmoidoscopy, flexible; diagnostic,                            including collection of specimen(s) by brushing or                            washing, when performed (separate procedure) Diagnosis Code(s):        --- Professional ---  D50.9, Iron deficiency anemia, unspecified CPT copyright 2019 American Medical Association. All rights reserved. The codes documented in this report are preliminary and upon coder review may  be revised to meet current compliance requirements. Clarene Essex, MD 09/21/2021 9:53:33 AM This report has been signed electronically. Number of Addenda: 0

## 2021-09-21 NOTE — Plan of Care (Signed)

## 2021-09-21 NOTE — Anesthesia Postprocedure Evaluation (Signed)
Anesthesia Post Note  Patient: Evan Brown  Procedure(s) Performed: ESOPHAGOGASTRODUODENOSCOPY (EGD) WITH PROPOFOL FLEXIBLE SIGMOIDOSCOPY     Patient location during evaluation: Endoscopy Anesthesia Type: MAC and General Level of consciousness: awake Pain management: pain level controlled Vital Signs Assessment: post-procedure vital signs reviewed and stable Respiratory status: spontaneous breathing Cardiovascular status: stable Postop Assessment: no apparent nausea or vomiting Anesthetic complications: no   No notable events documented.  Last Vitals:  Vitals:   09/21/21 1010 09/21/21 1015  BP:  131/90  Pulse:  64  Resp:  17  Temp:  36.6 C  SpO2: 100% 100%    Last Pain:  Vitals:   09/21/21 1015  TempSrc:   PainSc: 0-No pain                 Sherion Dooly

## 2021-09-21 NOTE — Progress Notes (Signed)
[facility]                                                                               Evan Brown, is a 56 y.o. 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    He was found to have hemoglobin of 3.8 . Pt was diagnosed with gastric cancer in 2020 FOBT is negative. Underwent  a total of 5 units of prbc transfusion , repeat hemoglobin is 7.6   GI consulted, Underwent EGD today showing Tiny hiatal hernia, Chronic gastritis,  Patent Billroth II gastrojejunostomy was found, characterized by erythema and friable mucosa. Colonoscopy was a poor prep and plan to repeat tomorrow.  He is back on clear liquid diet.    Assessment & Plan    Assessment and Plan: * Symptomatic anemia/ microcytic anemia/ vit B12 deficiency.  Patient presenting with several month history of progressively worsening generalized weakness and dyspnea on exertion Patient underwent partial gastrectomy for adenocarcinoma of the stomach in 2020, never followed up after.  GI consulted and he underwent EGD showing.Tiny hiatal hernia, Chronic gastritis,  Patent Billroth II gastrojejunostomy was found, characterized by erythema and friable mucosa. Colonoscopy was a poor prep and plan to repeat tomorrow. Pt is back on clears today.  Underwent 5 units of prbc transfusion since admission.  Transfusion to keep hemoglobin greater than 7. Hemoglobin is stable around 7.8 today.  Anemia panel revealed iron deficiency anemia and vitamin b12 deficiency.  Continue with PPI IV BID.  FOBT Is negative.  CT abdomen and  pelvis shows No CT evidence for acute intra-abdominal or pelvic abnormality. Interval distal gastrectomy with gastrojejunal anastomosis. Question mild wall thickening of the gastric remnant and at the anastomosis.  GERD (gastroesophageal reflux disease) Intravenous Protonix as noted above.  Nicotine dependence, cigarettes, uncomplicated Patient is being counseled daily on smoking cessation.   Thrombocytosis:  Probably reactive.   H/o gastric cancer:   s/p subtotal gastrectomy/B2 reconstruction 03/19/2019 by Dr Barry Dienes.  Recommend follow up with oncology after discharge.    Estimated body mass index is 20.72 kg/m as calculated from the following:   Height as of this encounter: '6\' 3"'$  (1.905 m).   Weight as of this encounter: 75.2 kg.  Code Status: full code  DVT Prophylaxis:  SCDs Start: 09/19/21 0254   Level of Care: Level of care: Telemetry Medical Family Communication: none at bedside.   Disposition Plan:     Remains inpatient appropriate:  SEVERE SYMPTOMATIC ANEMIA  Procedures:  EGD  Colonoscopy in am.   Consultants:   GI   Antimicrobials:   Anti-infectives (From admission, onward)    None        Medications  Scheduled Meds:  sodium chloride   Intravenous Once   cyanocobalamin  1,000 mcg Intramuscular  Weekly   pantoprazole (PROTONIX) IV  40 mg Intravenous Q12H   Continuous Infusions:  sodium chloride Stopped (09/19/21 0001)   PRN Meds:.acetaminophen **OR** acetaminophen, ondansetron **OR** ondansetron (ZOFRAN) IV, polyethylene glycol    Subjective:   Evan Brown was seen and examined today. No nausea, vomiting and abdominal pain.   Objective:   Vitals:   09/21/21 0945 09/21/21 1000 09/21/21 1010 09/21/21 1015  BP: 127/87 130/88  131/90  Pulse: 76 61  64  Resp: 17 (!) 23  17  Temp: 98.2 F (36.8 C)   97.9 F (36.6 C)  TempSrc:      SpO2: 98% 98% 100% 100%  Weight:      Height:        Intake/Output Summary (Last 24 hours) at 09/21/2021  1050 Last data filed at 09/21/2021 0932 Gross per 24 hour  Intake 200 ml  Output 400 ml  Net -200 ml    Filed Weights   09/19/21 1400 09/19/21 1801  Weight: 78 kg 75.2 kg     Exam General exam: Appears calm and comfortable  Respiratory system: Clear to auscultation. Respiratory effort normal. Cardiovascular system: S1 & S2 heard, RRR. No JVD, No pedal edema. Gastrointestinal system: Abdomen is nondistended, soft and nontender.  Normal bowel sounds heard. Central nervous system: Alert and oriented. No focal neurological deficits. Extremities: Symmetric 5 x 5 power. Skin: No rashes, lesions or ulcers Psychiatry: Judgement and insight appear normal. Mood & affect appropriate.      Data Reviewed:  I have personally reviewed following labs and imaging studies   CBC Lab Results  Component Value Date   WBC 8.8 09/21/2021   RBC 3.81 (L) 09/21/2021   HGB 7.8 (L) 09/21/2021   HCT 27.0 (L) 09/21/2021   MCV 70.9 (L) 09/21/2021   MCH 20.5 (L) 09/21/2021   PLT 498 (H) 09/21/2021   MCHC 28.9 (L) 09/21/2021   RDW 28.4 (H) 09/21/2021   LYMPHSABS 2.4 09/21/2021   MONOABS 0.7 09/21/2021   EOSABS 0.2 09/21/2021   BASOSABS 0.1 97/98/9211     Last metabolic panel Lab Results  Component Value Date   NA 138 09/21/2021   K 4.4 09/21/2021   CL 113 (H) 09/21/2021   CO2 21 (L) 09/21/2021   BUN 8 09/21/2021   CREATININE 0.75 09/21/2021   GLUCOSE 89 09/21/2021   GFRNONAA >60 09/21/2021   GFRAA >60 03/24/2019   CALCIUM 8.7 (L) 09/21/2021   PHOS 2.2 (L) 03/22/2019   PROT 5.8 (L) 09/19/2021   ALBUMIN 3.0 (L) 09/19/2021   BILITOT 1.3 (H) 09/19/2021   ALKPHOS 74 09/19/2021   AST 13 (L) 09/19/2021   ALT 13 09/19/2021   ANIONGAP 4 (L) 09/21/2021    CBG (last 3)  No results for input(s): GLUCAP in the last 72 hours.    Coagulation Profile: Recent Labs  Lab 09/19/21 1439 09/19/21 2139 09/20/21 0438  INR 1.1 1.2 1.2      Radiology Studies: No results  found.     Hosie Poisson M.D. [facility] 09/21/2021, 10:50 AM  Available via Epic secure chat 7am-7pm After 7 pm, please refer to night coverage provider listed on amion.

## 2021-09-22 ENCOUNTER — Inpatient Hospital Stay (HOSPITAL_COMMUNITY): Payer: Medicaid Other | Admitting: Certified Registered"

## 2021-09-22 ENCOUNTER — Encounter (HOSPITAL_COMMUNITY): Payer: Self-pay | Admitting: Gastroenterology

## 2021-09-22 ENCOUNTER — Encounter (HOSPITAL_COMMUNITY)
Admission: EM | Disposition: A | Discharge status: Home / Self Care | Payer: Self-pay | Source: Home / Self Care | Attending: Internal Medicine

## 2021-09-22 DIAGNOSIS — K644 Residual hemorrhoidal skin tags: Secondary | ICD-10-CM

## 2021-09-22 DIAGNOSIS — E538 Deficiency of other specified B group vitamins: Secondary | ICD-10-CM

## 2021-09-22 DIAGNOSIS — K648 Other hemorrhoids: Secondary | ICD-10-CM

## 2021-09-22 DIAGNOSIS — K5730 Diverticulosis of large intestine without perforation or abscess without bleeding: Secondary | ICD-10-CM

## 2021-09-22 DIAGNOSIS — D75839 Thrombocytosis, unspecified: Secondary | ICD-10-CM

## 2021-09-22 DIAGNOSIS — K635 Polyp of colon: Secondary | ICD-10-CM

## 2021-09-22 DIAGNOSIS — K2950 Unspecified chronic gastritis without bleeding: Secondary | ICD-10-CM

## 2021-09-22 HISTORY — PX: COLONOSCOPY: SHX5424

## 2021-09-22 HISTORY — PX: POLYPECTOMY: SHX5525

## 2021-09-22 LAB — BASIC METABOLIC PANEL
Anion gap: 3 — ABNORMAL LOW (ref 5–15)
BUN: 9 mg/dL (ref 6–20)
CO2: 22 mmol/L (ref 22–32)
Calcium: 8.6 mg/dL — ABNORMAL LOW (ref 8.9–10.3)
Chloride: 113 mmol/L — ABNORMAL HIGH (ref 98–111)
Creatinine, Ser: 0.79 mg/dL (ref 0.61–1.24)
GFR, Estimated: >60 mL/min (ref >60)
Glucose, Bld: 103 mg/dL — ABNORMAL HIGH (ref 70–99)
Potassium: 4.9 mmol/L (ref 3.5–5.1)
Sodium: 138 mmol/L (ref 135–145)

## 2021-09-22 LAB — CBC WITH DIFFERENTIAL/PLATELET
Abs Immature Granulocytes: 0.04 10*3/uL (ref 0.00–0.07)
Basophils Absolute: 0.1 10*3/uL (ref 0.0–0.1)
Basophils Relative: 1 %
Eosinophils Absolute: 0.3 10*3/uL (ref 0.0–0.5)
Eosinophils Relative: 3 %
HCT: 26.7 % — ABNORMAL LOW (ref 39.0–52.0)
Hemoglobin: 7.7 g/dL — ABNORMAL LOW (ref 13.0–17.0)
Immature Granulocytes: 0 %
Lymphocytes Relative: 23 %
Lymphs Abs: 2.2 10*3/uL (ref 0.7–4.0)
MCH: 20.8 pg — ABNORMAL LOW (ref 26.0–34.0)
MCHC: 28.8 g/dL — ABNORMAL LOW (ref 30.0–36.0)
MCV: 72 fL — ABNORMAL LOW (ref 80.0–100.0)
Monocytes Absolute: 0.7 10*3/uL (ref 0.1–1.0)
Monocytes Relative: 7 %
Neutro Abs: 6.4 10*3/uL (ref 1.7–7.7)
Neutrophils Relative %: 66 %
Platelets: 527 10*3/uL — ABNORMAL HIGH (ref 150–400)
RBC: 3.71 MIL/uL — ABNORMAL LOW (ref 4.22–5.81)
RDW: 29.8 % — ABNORMAL HIGH (ref 11.5–15.5)
WBC: 9.7 10*3/uL (ref 4.0–10.5)
nRBC: 0 % (ref 0.0–0.2)

## 2021-09-22 SURGERY — COLONOSCOPY
Anesthesia: Monitor Anesthesia Care

## 2021-09-22 MED ORDER — SODIUM CHLORIDE 0.9 % IV SOLN: INTRAVENOUS | Status: DC | PRN

## 2021-09-22 MED ORDER — HYDROCORTISONE (PERIANAL) 2.5 % EX CREA: 1.0000 "application " | TOPICAL_CREAM | Freq: Four times a day (QID) | CUTANEOUS | 0 refills | Status: DC | PRN

## 2021-09-22 MED ORDER — PROPOFOL 500 MG/50ML IV EMUL
INTRAVENOUS | Status: DC | PRN
  Administered 2021-09-22: 175 ug/kg/min via INTRAVENOUS
  Administered 2021-09-22: 125 ug/kg/min via INTRAVENOUS

## 2021-09-22 MED ORDER — PANTOPRAZOLE SODIUM 40 MG PO TBEC: 40.0000 mg | DELAYED_RELEASE_TABLET | Freq: Every day | ORAL | 0 refills | Status: DC

## 2021-09-22 MED ORDER — FERROUS SULFATE 325 (65 FE) MG PO TBEC: 325.0000 mg | DELAYED_RELEASE_TABLET | Freq: Two times a day (BID) | ORAL | 1 refills | Status: DC

## 2021-09-22 MED ORDER — EPHEDRINE SULFATE-NACL 50-0.9 MG/10ML-% IV SOSY
PREFILLED_SYRINGE | INTRAVENOUS | Status: DC | PRN
  Administered 2021-09-22 (×2): 10 mg via INTRAVENOUS

## 2021-09-22 MED ORDER — LACTATED RINGERS IV SOLN: INTRAVENOUS | Status: DC

## 2021-09-22 MED ORDER — LIDOCAINE 2% (20 MG/ML) 5 ML SYRINGE
INTRAMUSCULAR | Status: DC | PRN
  Administered 2021-09-22: 100 mg via INTRAVENOUS

## 2021-09-22 MED ORDER — VITAMIN B-12 1000 MCG PO TABS: 1000.0000 ug | ORAL_TABLET | Freq: Every day | ORAL | Status: DC

## 2021-09-22 MED ORDER — PANTOPRAZOLE SODIUM 40 MG PO TBEC: 40.0000 mg | DELAYED_RELEASE_TABLET | Freq: Every day | ORAL | Status: DC

## 2021-09-22 MED ORDER — PROPOFOL 10 MG/ML IV BOLUS
INTRAVENOUS | Status: DC | PRN
  Administered 2021-09-22: 50 mg via INTRAVENOUS
  Administered 2021-09-22: 30 mg via INTRAVENOUS
  Administered 2021-09-22: 20 mg via INTRAVENOUS

## 2021-09-22 MED ORDER — PEG 3350-KCL-NA BICARB-NACL 420 G PO SOLR
4000.0000 mL | Freq: Once | ORAL | Status: AC
  Filled 2021-09-22: qty 4000

## 2021-09-22 MED ORDER — SODIUM CHLORIDE 0.9 % IV SOLN: INTRAVENOUS | Status: DC

## 2021-09-22 NOTE — Discharge Summary (Signed)
Physician Discharge Summary  Evan Brown:[id number] DOB: [birth year] DOA: 09/18/2021  PCP: Pcp, No  Admit date: 09/18/2021 Discharge date: 09/22/2021 Admitted From: Home Disposition: Home Recommendations for Outpatient Follow-up:  Follow ups as below. GI to arrange outpatient follow-up. Please obtain CBC/BMP/Mag at follow up Please follow up on the following pending results: None  Home Health: Not indicated Equipment/Devices: Not indicated  Discharge Condition: Stable CODE STATUS: Full code  Follow-up [facility] Follow up on 10/20/2021.   Why: Post hospital follow up and to establish primary care scheduled for 2:10 pm with Freeman Caldron PA Contact information: [address] [place] [id number] [facility] 56 year old M with history of gastric cancer s/p partial gastrectomy and omentectomy in 03/2019, GERD and tobacco use disorder presented to PCP with with generalized weakness and dyspnea on exertion for over a month.  Were concerning for severe anemia.  He was directed to ED for further evaluation and care.  In ED, vitals are stable.  Hgb 3.8.  He was transfused a total of 5 units.  Hemoglobin improved to 7.6 and remained stable.  EGD showed tiny hiatal hernia, chronic gastritis and patent Billroth II gastrojejunostomy characterized by erythema and friable mucosa.  Initial colonoscopy was a poor prep.  Repeat colonoscopy on the day of discharge with external and internal hemorrhoids, sigmoid diverticulosis and 2 small medium polyps in the sigmoid colon that were removed with a hot snare and retrieved.   Patient received IV iron and vitamin B12 injection while in-house.  He is discharged on p.o. Protonix 40 mg daily, p.o. ferrous sulfate and p.o. vitamin B12.  Renewed his prescription for Anusol HC.  He was advised to avoid NSAID.  Also advised to continue soft  diet at least for 3 to 4 days.    See individual problem list below for more on hospital course.  Problems addressed during this hospitalization Problem  Symptomatic iron deficiency anemia/acute on chronic blood loss anemia due to upper GI bleed  History of Gastric Cancer  Gerd (Gastroesophageal Reflux Disease)  Nicotine Dependence, Cigarettes, Uncomplicated  Chronic Gastritis  Internal Hemorrhoid  External Hemorrhoid  Sigmoid Diverticulosis  Vitamin B12 Deficiency  Thrombocytosis    Assessment and Plan: * Symptomatic iron deficiency anemia/acute on chronic blood loss anemia due to upper GI bleed EGD and colonoscopy as above.  Anemia panel consistent with iron deficiency and vitamin B12 deficiency.  Transfused a total of 5 units (1.2 L).  Hgb remained stable at about 7.7.  Discharged on Protonix, ferrous sulfate and vitamin B12.  Soft diet for the next 3 to 4 days.  History of gastric cancer S/p partial gastrectomy and omentectomy in 03/2019, and lost to follow-up.  EGD finding as above.  Outpatient follow-up with gastroenterology and oncology  GERD (gastroesophageal reflux disease) PPI as above  Nicotine dependence, cigarettes, uncomplicated Encouraged smoking cessation.  Thrombocytosis Likely reactive.  Recheck CBC at follow-up  Vitamin B12 deficiency Received IV vitamin B12 injection in house and discharged on p.o.  External hemorrhoid Anusol cream  Internal hemorrhoid Renewed his prescription for Anusol cream  Chronic gastritis Management as above    Vital signs Vitals:   09/22/21 1520 09/22/21 1530 09/22/21 1540 09/22/21 1615  BP: 131/77 133/79 135/75 129/89  Pulse: 60 72 72 (!) 58  Temp:      Resp:  14 19 (!) 22 16  Height:      Weight:      SpO2: 99% 100% 99% 100%  TempSrc:      BMI (Calculated):         Discharge exam  GENERAL: No apparent distress.  Nontoxic. HEENT: MMM.  Vision and hearing grossly intact.  NECK: Supple.  No apparent JVD.   RESP:  No IWOB.  Fair aeration bilaterally. CVS:  RRR. Heart sounds normal.  ABD/GI/GU: BS+. Abd soft, NTND.  MSK/EXT:  Moves extremities. No apparent deformity. No edema.  SKIN: no apparent skin lesion or wound NEURO: Awake and alert. Oriented appropriately.  No apparent focal neuro deficit. PSYCH: Calm. Normal affect.   Discharge Instructions Discharge Instructions     Call MD for:  difficulty breathing, headache or visual disturbances   Complete by: As directed    Call MD for:  extreme fatigue   Complete by: As directed    Call MD for:  persistant dizziness or light-headedness   Complete by: As directed    Diet general   Complete by: As directed    Continue mechanically soft diet for the next 3 to 4 days.   Discharge instructions   Complete by: As directed    It has been a pleasure taking care of you!  You were hospitalized with severe anemia likely due to gastrointestinal bleed.  Your bleeding seems to have subsided.  We are discharging you on oral Protonix to reduce your risk of further bleeding.  We have also started you on iron tablets and vitamin B12 to help with blood production.  We recommend you start iron next week.  Iron could cause constipation and make you stool looked dark.  You may take some stool softener such as Senokot-S to help with constipation when you start taking iron.  We strongly recommend you avoid any over-the-counter pain medication other than plain Tylenol.  Please review your new medication list and the directions on your medications before you take them.  Follow-up with your primary care doctor in 1 to 2 weeks.  The gastroenterologist office will reach out to you to discuss about pathology results from the endoscopy and colonoscopy, and set up outpatient follow-up.   Take care,   Increase activity slowly   Complete by: As directed       Allergies as of 09/22/2021   No Known Allergies      Medication List     STOP taking these medications     feeding supplement (OSMOLITE 1.5 CAL) Liqd   feeding supplement Liqd   omeprazole 40 MG capsule Commonly known as: PriLOSEC   ondansetron 4 MG disintegrating tablet Commonly known as: Zofran ODT   oxyCODONE 5 MG immediate release tablet Commonly known as: Oxy IR/ROXICODONE   promethazine 25 MG tablet Commonly known as: PHENERGAN   traMADol 50 MG tablet Commonly known as: ULTRAM       TAKE these medications    acetaminophen 500 MG tablet Commonly known as: TYLENOL Take 500 mg by mouth every 6 (six) hours as needed for moderate pain.   ferrous sulfate 325 (65 FE) MG EC tablet Take 1 tablet (325 mg total) by mouth 2 (two) times daily. Start taking on: September 29, 2021   hydrocortisone 2.5 % rectal cream Commonly known as: ANUSOL-HC Apply 1 application. topically 4 (four) times daily as needed for hemorrhoids.   pantoprazole 40 MG tablet Commonly known as: PROTONIX Take 1 tablet (40 mg total) by mouth daily.  Start taking on: September 23, 2021   vitamin B-12 1000 MCG tablet Commonly known as: CYANOCOBALAMIN Take 1 tablet (1,000 mcg total) by mouth daily.        Consultations: Gastroenterology  Procedures/Studies: EGD and colonoscopy as above   CT ABDOMEN PELVIS W CONTRAST  Result Date: 09/19/2021 CLINICAL DATA:  Epigastric pain low hemoglobin EXAM: CT ABDOMEN AND PELVIS WITH CONTRAST TECHNIQUE: Multidetector CT imaging of the abdomen and pelvis was performed using the standard protocol following bolus administration of intravenous contrast. RADIATION DOSE REDUCTION: This exam was performed according to the departmental dose-optimization program which includes automated exposure control, adjustment of the mA and/or kV according to patient size and/or use of iterative reconstruction technique. CONTRAST:  14m OMNIPAQUE IOHEXOL 300 MG/ML  SOLN COMPARISON:  CT 12/27/2018, 11/05/2018, 01/09/2016 FINDINGS: Lower chest: Lung bases demonstrate no acute consolidation or  pleural effusion. Hepatobiliary: Small gallstone. No biliary dilatation. No focal hepatic abnormality. Pancreas: Unremarkable. No pancreatic ductal dilatation or surrounding inflammatory changes. Spleen: Normal in size without focal abnormality. Adrenals/Urinary Tract: Right adrenal gland is normal. 2.3 cm left adrenal mass stable compared with the previous exams and presumably representing an adenoma, no follow-up imaging is recommended. Kidneys show no hydronephrosis. The bladder is slightly thick walled Stomach/Bowel: Interval distal gastrectomy with gastrojejunostomy. No obstructive features. Questionable gastric wall thickening of the gastric remanent and at the anastomosis. No dilated small bowel. Negative appendix. No acute bowel wall thickening Vascular/Lymphatic: Mild aortic atherosclerosis. No aneurysm. No suspicious lymph nodes. Reproductive: Prostate is unremarkable. Other: Negative for pelvic effusion or free air. Musculoskeletal: No acute osseous abnormality IMPRESSION: 1. No CT evidence for acute intra-abdominal or pelvic abnormality. 2. Interval distal gastrectomy with gastrojejunal anastomosis. Question mild wall thickening of the gastric remnant and at the anastomosis, this would be better evaluated with endoscopy 3. Small gallstone Electronically Signed   By: KDonavan FoilM.D.   On: 09/19/2021 03:35       The results of significant diagnostics from this hospitalization (including imaging, microbiology, ancillary and laboratory) are listed below for reference.     Microbiology: No results found for this or any previous visit (from the past 240 hour(s)).   Labs:  CBC: Recent Labs  Lab 09/18/21 2122 09/19/21 1403 09/19/21 1439 09/19/21 1831 09/20/21 0438 09/21/21 0254 09/22/21 0409  WBC 8.7  --  8.7 9.4  --  8.8 9.7  NEUTROABS  --   --   --   --   --  5.4 6.4  HGB 3.8*   < > 6.6* 6.9* 7.6* 7.8* 7.7*  HCT 15.3*   < > 22.4* 23.1* 24.8* 27.0* 26.7*  MCV 60.2*  --  68.1* 67.7*   --  70.9* 72.0*  PLT 451*  --  407* 420*  --  498* 527*   < > = values in this interval not displayed.   BMP &GFR Recent Labs  Lab 09/18/21 2122 09/19/21 1403 09/19/21 1439 09/21/21 0254 09/22/21 0409  NA 140 139 136 138 138  K 4.0 4.3 4.3 4.4 4.9  CL 111 105 110 113* 113*  CO2 23  --  22 21* 22  GLUCOSE 88 130* 79 89 103*  BUN '13 9 9 8 9  '$ CREATININE 0.83 0.70 0.73 0.75 0.79  CALCIUM 8.8*  --  8.1* 8.7* 8.6*  MG  --   --  2.2  --   --    Estimated Creatinine Clearance: 106.5 mL/min (by C-G formula based on SCr of 0.79 mg/dL). Liver &  Pancreas: Recent Labs  Lab 09/18/21 2122 09/19/21 1439  AST 13* 13*  ALT 16 13  ALKPHOS 92 74  BILITOT 0.4 1.3*  PROT 6.5 5.8*  ALBUMIN 3.4* 3.0*   No results for input(s): LIPASE, AMYLASE in the last 168 hours. No results for input(s): AMMONIA in the last 168 hours. Diabetic: No results for input(s): HGBA1C in the last 72 hours. No results for input(s): GLUCAP in the last 168 hours. Cardiac Enzymes: No results for input(s): CKTOTAL, CKMB, CKMBINDEX, TROPONINI in the last 168 hours. No results for input(s): PROBNP in the last 8760 hours. Coagulation Profile: Recent Labs  Lab 09/19/21 1439 09/19/21 2139 09/20/21 0438  INR 1.1 1.2 1.2   Thyroid Function Tests: No results for input(s): TSH, T4TOTAL, FREET4, T3FREE, THYROIDAB in the last 72 hours. Lipid Profile: No results for input(s): CHOL, HDL, LDLCALC, TRIG, CHOLHDL, LDLDIRECT in the last 72 hours. Anemia Panel: No results for input(s): VITAMINB12, FOLATE, FERRITIN, TIBC, IRON, RETICCTPCT in the last 72 hours. Urine analysis:    Component Value Date/Time   COLORURINE YELLOW 12/27/2018 [facility] 12/27/2018 0819   LABSPEC 1.019 12/27/2018 0819   PHURINE 5.0 12/27/2018 0819   GLUCOSEU NEGATIVE 12/27/2018 0819   HGBUR NEGATIVE 12/27/2018 0819   BILIRUBINUR NEGATIVE 12/27/2018 0819   KETONESUR 5 (A) 12/27/2018 0819   PROTEINUR NEGATIVE 12/27/2018 0819    NITRITE NEGATIVE 12/27/2018 0819   LEUKOCYTESUR NEGATIVE 12/27/2018 0819   Sepsis Labs: Invalid input(s): PROCALCITONIN, LACTICIDVEN   Time coordinating discharge: 45 minutes  SIGNED:  Mercy Riding, MD  [facility] 09/22/2021, 9:32 PM

## 2021-09-22 NOTE — Assessment & Plan Note (Signed)
Renewed his prescription for Anusol cream

## 2021-09-22 NOTE — Op Note (Signed)
[facility] Patient Name: Evan Brown Procedure Date : 09/22/2021 MRN: [id number] Attending MD: Clarene Essex , MD Date of Birth: October 29, 1965 CSN: [id number] Age: 56 Admit Type: Inpatient Procedure:                Colonoscopy Indications:              This is the patient's first colonoscopy,                            Unexplained iron deficiency anemia Providers:                Clarene Essex, MD, Glori Bickers, RN, William Dalton,                            Technician, Darliss Cheney, Technician Referring MD:              Medicines:                Monitored Anesthesia Care Complications:            No immediate complications. Estimated Blood Loss:     Estimated blood loss: none. Procedure:                Pre-Anesthesia Assessment:                           - Prior to the procedure, a History and Physical                            was performed, and patient medications and                            allergies were reviewed. The patient's tolerance of                            previous anesthesia was also reviewed. The risks                            and benefits of the procedure and the sedation                            options and risks were discussed with the patient.                            All questions were answered, and informed consent                            was obtained. Prior Anticoagulants: The patient has                            taken no previous anticoagulant or antiplatelet                            agents. ASA Grade Assessment: II - A patient with  mild systemic disease. After reviewing the risks                            and benefits, the patient was deemed in                            satisfactory condition to undergo the procedure.                           After obtaining informed consent, the colonoscope                            was passed under direct vision. Throughout the                            procedure, the  patient's blood pressure, pulse, and                            oxygen saturations were monitored continuously. The                            [id number]-HQ190TL ([id number] Olympus peds colonoscope was                            introduced through the anus and advanced to the the                            terminal ileum. The terminal ileum, ileocecal                            valve, appendiceal orifice, and rectum were                            photographed. The colonoscopy was somewhat                            difficult due to significant looping and a tortuous                            colon. Successful completion of the procedure was                            aided by applying abdominal pressure and lavage.                            The patient tolerated the procedure well. The                            quality of the bowel preparation was adequate to                            identify polyps 6 mm and larger in size except for  in the splenic flexure region in the proximal                            descending where there was too much vegetable                            debris to suction and we continue to clog our scope. Scope In: 2:23:49 PM Scope Out: 3:03:11 PM Scope Withdrawal Time: 0 hours 25 minutes 47 seconds  Total Procedure Duration: 0 hours 39 minutes 22 seconds  Findings:      External and internal hemorrhoids were found during retroflexion, during       perianal exam and during digital exam. The hemorrhoids were small.      Scattered small-mouthed diverticula were found in the sigmoid colon.      Two semi-sessile and semipedunculated polyps were found in the sigmoid       colon. The polyps were small-medium in size. These polyps were removed       with a hot snare. Resection and retrieval were complete.      The terminal ileum appeared normal.      A moderate amount of semi-liquid stool was found in the proximal       descending colon and at  the splenic flexure, interfering with       visualization. Lavage of the area was performed using a moderate amount       of sterile water, resulting in incomplete clearance with continued poor       visualization.      The exam was otherwise without abnormality. Impression:               - External and internal hemorrhoids.                           - Diverticulosis in the sigmoid colon.                           - Two small-medium polyps in the sigmoid colon,                            removed with a hot snare. Resected and retrieved.                           - The examined portion of the ileum was normal.                           - Stool in the proximal descending colon and at the                            splenic flexure.                           - The examination was otherwise normal. Recommendation:           - Soft diet today.                           - Continue present medications.                           -  Await pathology results.                           - Repeat colonoscopy date to be determined after                            pending pathology results are reviewed for                            surveillance based on pathology results. With                            avoidance of uncooked vegetables nuts popcorn etc.                            for 1 week and at least an extra prep or 2 days of                            clear liquids                           - Return to GI office PRN. I would give him a dose                            of B12 IM and IV iron before discharge and                            follow-up with hematology in 1 month to decide how                            often he needs those injections                           - Telephone GI clinic for pathology results in 1                            week.                           - Telephone GI clinic if symptomatic PRN. Procedure Code(s):        --- Professional ---                           [phone],  Colonoscopy, flexible; with removal of                            tumor(s), polyp(s), or other lesion(s) by snare                            technique Diagnosis Code(s):        --- Professional ---                           K63.5, Polyp of colon  D50.9, Iron deficiency anemia, unspecified                           K57.30, Diverticulosis of large intestine without                            perforation or abscess without bleeding CPT copyright 2019 [employer]. All rights reserved. The codes documented in this report are preliminary and upon coder review may  be revised to meet current compliance requirements. Clarene Essex, MD 09/22/2021 3:19:08 PM This report has been signed electronically. Number of Addenda: 0

## 2021-09-22 NOTE — Anesthesia Preprocedure Evaluation (Signed)
Anesthesia Evaluation  Patient identified by MRN, date of birth, ID band Patient awake    Reviewed: Allergy & Precautions, NPO status , Patient's Chart, lab work & pertinent test results  Airway Mallampati: II  TM Distance: >3 FB Neck ROM: Full    Dental no notable dental hx.    Pulmonary Current Smoker and Patient abstained from smoking.,    Pulmonary exam normal breath sounds clear to auscultation       Cardiovascular negative cardio ROS Normal cardiovascular exam Rhythm:Regular Rate:Normal     Neuro/Psych    GI/Hepatic Neg liver ROS, GERD  ,  Endo/Other  negative endocrine ROS  Renal/GU      Musculoskeletal   Abdominal   Peds  Hematology  (+) Blood dyscrasia, anemia ,   Anesthesia Other Findings   Reproductive/Obstetrics                             Anesthesia Physical  Anesthesia Plan  ASA: 3  Anesthesia Plan: MAC   Post-op Pain Management: Minimal or no pain anticipated   Induction: Intravenous  PONV Risk Score and Plan: Treatment may vary due to age or medical condition and Propofol infusion  Airway Management Planned: Nasal Cannula and Simple Face Mask  Additional Equipment:   Intra-op Plan:   Post-operative Plan:   Informed Consent: I have reviewed the patients History and Physical, chart, labs and discussed the procedure including the risks, benefits and alternatives for the proposed anesthesia with the patient or authorized representative who has indicated his/her understanding and acceptance.     Dental advisory given  Plan Discussed with: CRNA and Anesthesiologist  Anesthesia Plan Comments:         Anesthesia Quick Evaluation

## 2021-09-22 NOTE — Anesthesia Postprocedure Evaluation (Signed)
Anesthesia Post Note  Patient: ADOLPHO MEENACH  Procedure(s) Performed: COLONOSCOPY POLYPECTOMY     Patient location during evaluation: PACU Anesthesia Type: MAC Level of consciousness: awake and alert Pain management: pain level controlled Vital Signs Assessment: post-procedure vital signs reviewed and stable Respiratory status: spontaneous breathing, nonlabored ventilation and respiratory function stable Cardiovascular status: blood pressure returned to baseline and stable Postop Assessment: no apparent nausea or vomiting Anesthetic complications: no   No notable events documented.  Last Vitals:  Vitals:   09/22/21 1510 09/22/21 1520  BP: 113/61 131/77  Pulse: 64 60  Resp: 15 14  Temp: 36.5 C   SpO2: 100% 99%    Last Pain:  Vitals:   09/22/21 1520  TempSrc:   PainSc: 0-No pain                 Lynda Rainwater

## 2021-09-22 NOTE — Assessment & Plan Note (Signed)
Received IV vitamin B12 injection in house and discharged on p.o.

## 2021-09-22 NOTE — Progress Notes (Signed)
Pt discharged to home. DC instructions given with male family member at bedside. No concerns voiced. Pt encouraged to stop by pharmacy and pick up meds that were e-prescribed by Provider. Voiced understanding. Left unit in wheelchair pushed by Benjamin Perez, [place]. Left in stable condition.

## 2021-09-22 NOTE — Assessment & Plan Note (Signed)
Anusol cream

## 2021-09-22 NOTE — Assessment & Plan Note (Signed)
Management as above °

## 2021-09-22 NOTE — Transfer of Care (Signed)
Immediate Anesthesia Transfer of Care Note  Patient: Evan Brown  Procedure(s) Performed: COLONOSCOPY POLYPECTOMY  Patient Location: Endoscopy Unit  Anesthesia Type:MAC  Level of Consciousness: drowsy and patient cooperative  Airway & Oxygen Therapy: Patient Spontanous Breathing  Post-op Assessment: Report given to RN and Post -op Vital signs reviewed and stable  Post vital signs: Reviewed and stable  Last Vitals:  Vitals Value Taken Time  BP 113/61 09/22/21 1509  Temp    Pulse 63 09/22/21 1511  Resp 15 09/22/21 1511  SpO2 100 % 09/22/21 1511  Vitals shown include unvalidated device data.  Last Pain:  Vitals:   09/22/21 1325  TempSrc: Temporal  PainSc: 0-No pain         Complications: No notable events documented.

## 2021-09-22 NOTE — Assessment & Plan Note (Signed)
Likely reactive.  Recheck CBC at follow-up

## 2021-09-22 NOTE — Hospital Course (Signed)
56 year old M with history of gastric cancer s/p partial gastrectomy and omentectomy in 03/2019, GERD and tobacco use disorder presented to PCP with with generalized weakness and dyspnea on exertion for over a month.  Were concerning for severe anemia.  He was directed to ED for further evaluation and care.  In ED, vitals are stable.  Hgb 3.8.  He was transfused a total of 5 units.  Hemoglobin improved to 7.6 and remained stable.  EGD showed tiny hiatal hernia, chronic gastritis and patent Billroth II gastrojejunostomy characterized by erythema and friable mucosa.  Initial colonoscopy was a poor prep.  Repeat colonoscopy on the day of discharge with external and internal hemorrhoids, sigmoid diverticulosis and 2 small medium polyps in the sigmoid colon that were removed with a hot snare and retrieved.   Patient received IV iron and vitamin B12 injection while in-house.  He is discharged on p.o. Protonix 40 mg daily, p.o. ferrous sulfate and p.o. vitamin B12.  Renewed his prescription for Anusol HC.  He was advised to avoid NSAID.  Also advised to continue soft diet at least for 3 to 4 days.

## 2021-09-22 NOTE — Progress Notes (Signed)
Evan Brown 2:07 PM  Subjective: Patient hopefully drank enough of the prep no new complaints no signs of bleeding  Objective: Vital signs stable afebrile no acute distress exam please see preassessment evaluation chemistries okay hemoglobin stable  Assessment: Iron deficiency probably secondary to gastric cancer surgery  Plan: Okay to proceed with colonoscopy with anesthesia assistance with further work-up and plans pending those findings  [facility]  office [phone] After 5PM or if no answer call [phone]

## 2021-09-23 ENCOUNTER — Encounter (HOSPITAL_COMMUNITY): Payer: Self-pay | Admitting: Gastroenterology

## 2021-09-24 LAB — SURGICAL PATHOLOGY

## 2021-10-20 ENCOUNTER — Encounter: Payer: Self-pay | Admitting: Physician Assistant

## 2021-10-20 ENCOUNTER — Other Ambulatory Visit: Payer: Self-pay

## 2021-10-20 ENCOUNTER — Ambulatory Visit: Payer: Medicaid Other | Attending: Physician Assistant | Admitting: Physician Assistant

## 2021-10-20 VITALS — BP 146/80 | HR 98 | Wt 169.2 lb

## 2021-10-20 DIAGNOSIS — E538 Deficiency of other specified B group vitamins: Secondary | ICD-10-CM

## 2021-10-20 DIAGNOSIS — D649 Anemia, unspecified: Secondary | ICD-10-CM

## 2021-10-20 DIAGNOSIS — D75839 Thrombocytosis, unspecified: Secondary | ICD-10-CM

## 2021-10-20 DIAGNOSIS — Z85028 Personal history of other malignant neoplasm of stomach: Secondary | ICD-10-CM | POA: Diagnosis not present

## 2021-10-20 DIAGNOSIS — D638 Anemia in other chronic diseases classified elsewhere: Secondary | ICD-10-CM

## 2021-10-20 MED ORDER — VITAMIN B-12 1000 MCG PO TABS
1000.0000 ug | ORAL_TABLET | Freq: Every day | ORAL | 1 refills | Status: DC
  Filled 2021-10-20 – 2022-01-03 (×6): qty 90, 90d supply, fill #0
  Filled 2022-03-31 – 2022-04-28 (×2): qty 90, 90d supply, fill #1

## 2021-10-20 MED ORDER — PANTOPRAZOLE SODIUM 40 MG PO TBEC
40.0000 mg | DELAYED_RELEASE_TABLET | Freq: Every day | ORAL | 1 refills | Status: DC
  Filled 2021-10-20 – 2021-12-25 (×3): qty 90, 90d supply, fill #0
  Filled 2022-03-28: qty 90, 90d supply, fill #1

## 2021-10-20 MED ORDER — FERROUS SULFATE 325 (65 FE) MG PO TABS
325.0000 mg | ORAL_TABLET | Freq: Two times a day (BID) | ORAL | 1 refills | Status: DC
  Filled 2021-10-20 – 2021-12-03 (×2): qty 180, 90d supply, fill #0
  Filled 2021-12-17: qty 100, 50d supply, fill #0
  Filled 2022-03-28 – 2022-04-25 (×2): qty 100, 50d supply, fill #1

## 2021-10-20 NOTE — Progress Notes (Signed)
Patient ID: Evan Brown, male   DOB: [birth year], 56 y.o.   MRN: [id number]  Evan Brown, is a 56 y.o. male  VOH:[id number]  IRS:[id number]  DOB - [birth year]  Chief Complaint  Patient presents with   Hospitalization Follow-up       Subjective:   Evan Brown is a 56 y.o. male here today for a follow up visit and to establish care after hospitalization 6/3 to 09/22/2021 with severe anemia.  Today he presents with his wife.  He is feeling really good.  His appetite is good.  No N/V.  Has not noticed blood in stool.  Says he "feels better than I have in a long time."  From discharge summary: Hospital course 56 year old M with history of gastric cancer s/p partial gastrectomy and omentectomy in 03/2019, GERD and tobacco use disorder presented to PCP with with generalized weakness and dyspnea on exertion for over a month.  Were concerning for severe anemia.  He was directed to ED for further evaluation and care.   In ED, vitals are stable.  Hgb 3.8.  He was transfused a total of 5 units.  Hemoglobin improved to 7.6 and remained stable.  EGD showed tiny hiatal hernia, chronic gastritis and patent Billroth II gastrojejunostomy characterized by erythema and friable mucosa.  Initial colonoscopy was a poor prep.  Repeat colonoscopy on the day of discharge with external and internal hemorrhoids, sigmoid diverticulosis and 2 small medium polyps in the sigmoid colon that were removed with a hot snare and retrieved.    Patient received IV iron and vitamin B12 injection while in-house.  He is discharged on p.o. Protonix 40 mg daily, p.o. ferrous sulfate and p.o. vitamin B12.  Renewed his prescription for Anusol HC.  He was advised to avoid NSAID.  Also advised to continue soft diet at least for 3 to 4 days.   Symptomatic iron deficiency anemia/acute on chronic blood loss anemia due to upper GI bleed  History of Gastric Cancer  Gerd (Gastroesophageal Reflux Disease)  Nicotine Dependence, Cigarettes,  Uncomplicated  Chronic Gastritis  Internal Hemorrhoid  External Hemorrhoid  Sigmoid Diverticulosis  Vitamin B12 Deficiency  Thrombocytosis    Assessment and Plan: * Symptomatic iron deficiency anemia/acute on chronic blood loss anemia due to upper GI bleed EGD and colonoscopy as above.  Anemia panel consistent with iron deficiency and vitamin B12 deficiency.  Transfused a total of 5 units (1.2 L).  Hgb remained stable at about 7.7.  Discharged on Protonix, ferrous sulfate and vitamin B12.  Soft diet for the next 3 to 4 days.   History of gastric cancer S/p partial gastrectomy and omentectomy in 03/2019, and lost to follow-up.  EGD finding as above.  Outpatient follow-up with gastroenterology and oncology   GERD (gastroesophageal reflux disease) PPI as above   Nicotine dependence, cigarettes, uncomplicated Encouraged smoking cessation.   Thrombocytosis Likely reactive.  Recheck CBC at follow-up   Vitamin B12 deficiency Received IV vitamin B12 injection in house and discharged on p.o.   External hemorrhoid Anusol cream   Internal hemorrhoid Renewed his prescription for Anusol cream   Chronic gastritis Management as above   No problems updated.  ALLERGIES: No Known Allergies  PAST MEDICAL HISTORY: Past Medical History:  Diagnosis Date   Gastric cancer pT1pN0 (0/12LN) s/p subtotal gastrectomy/B2 reconstruction 03/19/2019 03/19/2019    MEDICATIONS AT HOME: Prior to Admission medications   Medication Sig Start Date End Date Taking? Authorizing Provider  acetaminophen (TYLENOL) 500 MG tablet Take 500 mg by  mouth every 6 (six) hours as needed for moderate pain.    [provider]  ferrous sulfate 325 (65 FE) MG tablet Take 1 tablet (325 mg total) by mouth 2 (two) times daily. 10/20/21 04/18/22  Argentina Donovan, PA-C  hydrocortisone (ANUSOL-HC) 2.5 % rectal cream Apply 1 application. topically 4 (four) times daily as needed for hemorrhoids. 09/22/21   Mercy Riding, MD   pantoprazole (PROTONIX) 40 MG tablet Take 1 tablet (40 mg total) by mouth daily. 10/20/21   Argentina Donovan, PA-C  vitamin B-12 (CYANOCOBALAMIN) 1000 MCG tablet Take 1 tablet (1,000 mcg total) by mouth daily. 10/20/21   Kortney Potvin, Dionne Bucy, PA-C    ROS: Neg HEENT Neg resp Neg cardiac Neg GI Neg GU Neg MS Neg psych Neg neuro  Objective:   Vitals:   10/20/21 1414  BP: (!) 146/80  Pulse: 98  SpO2: 96%  Weight: 169 lb 3.2 oz (76.7 kg)   Exam General appearance : Awake, alert, not in any distress. Speech Clear. Not toxic looking HEENT: Atraumatic and Normocephalic Neck: Supple, no JVD. No cervical lymphadenopathy.  Chest: Good air entry bilaterally, CTAB.  No rales/rhonchi/wheezing CVS: S1 S2 regular, no murmurs.  Extremities: B/L Lower Ext shows no edema, both legs are warm to touch Neurology: Awake alert, and oriented X 3, CN II-XII intact, Non focal Skin: No Rash  Data Review No results found for: "HGBA1C"  Assessment & Plan   1. Anemia, chronic disease Continue OTC iron tabs-- ferrous sulfate 325 (65 FE) MG tablet; Take 1 tablet (325 mg total) by mouth 2 (two) times daily.  Dispense: 180 tablet; Refill: 1 - CBC with Differential/Platelet - Iron, TIBC and Ferritin Panel Refer heme/onc and GI  2. History of gastric cancer-2020-lost to f/up Refer heme/onc and GI  3. Vitamin B12 deficiency Refer heme/onc and GI - vitamin B-12 (CYANOCOBALAMIN) 1000 MCG tablet; Take 1 tablet (1,000 mcg total) by mouth daily.  Dispense: 90 tablet; Refill: 1 - B12 and Folate Panel  4. Thrombocytosis Refer heme/onc and GI - ferrous sulfate 325 (65 FE) MG tablet; Take 1 tablet (325 mg total) by mouth 2 (two) times daily.  Dispense: 180 tablet; Refill: 1 - CBC with Differential/Platelet - Iron, TIBC and Ferritin Panel  5. Symptomatic iron deficiency anemia/acute on chronic blood loss anemia due to upper GI bleed Refer heme/onc and GI - pantoprazole (PROTONIX) 40 MG tablet; Take 1 tablet  (40 mg total) by mouth daily.  Dispense: 90 tablet; Refill: 1 - ferrous sulfate 325 (65 FE) MG tablet; Take 1 tablet (325 mg total) by mouth 2 (two) times daily.  Dispense: 180 tablet; Refill: 1    Return in about 2 months (around 12/21/2021) for assign PCP.  The patient was given clear instructions to go to ER or return to medical center if symptoms don't improve, worsen or new problems develop. The patient verbalized understanding. The patient was told to call to get lab results if they haven't heard anything in the next week.      Freeman Caldron, PA-C [facility] and [place], [place]   10/20/2021, 2:28 PM

## 2021-10-21 LAB — CBC WITH DIFFERENTIAL/PLATELET
Basophils Absolute: 0.1 10*3/uL (ref 0.0–0.2)
Basos: 1 %
EOS (ABSOLUTE): 0.1 10*3/uL (ref 0.0–0.4)
Eos: 2 %
Hematocrit: 38.8 % (ref 37.5–51.0)
Hemoglobin: 11.5 g/dL — ABNORMAL LOW (ref 13.0–17.7)
Immature Grans (Abs): 0 10*3/uL (ref 0.0–0.1)
Immature Granulocytes: 0 %
Lymphocytes Absolute: 1.7 10*3/uL (ref 0.7–3.1)
Lymphs: 20 %
MCH: 23.6 pg — ABNORMAL LOW (ref 26.6–33.0)
MCHC: 29.6 g/dL — ABNORMAL LOW (ref 31.5–35.7)
MCV: 80 fL (ref 79–97)
Monocytes Absolute: 0.6 10*3/uL (ref 0.1–0.9)
Monocytes: 7 %
Neutrophils Absolute: 5.9 10*3/uL (ref 1.4–7.0)
Neutrophils: 70 %
Platelets: 381 10*3/uL (ref 150–450)
RBC: 4.88 x10E6/uL (ref 4.14–5.80)
RDW: 26.1 % — ABNORMAL HIGH (ref 11.6–15.4)
WBC: 8.5 10*3/uL (ref 3.4–10.8)

## 2021-10-21 LAB — B12 AND FOLATE PANEL
Folate: >20 ng/mL (ref >3.0)
Vitamin B-12: 496 pg/mL (ref 232–1245)

## 2021-10-21 LAB — IRON,TIBC AND FERRITIN PANEL
Ferritin: 20 ng/mL — ABNORMAL LOW (ref 30–400)
Iron Saturation: 55 % (ref 15–55)
Iron: 282 ug/dL (ref 38–169)
Total Iron Binding Capacity: 511 ug/dL — ABNORMAL HIGH (ref 250–450)
UIBC: 229 ug/dL (ref 111–343)

## 2021-10-26 ENCOUNTER — Telehealth: Payer: Self-pay | Admitting: Hematology

## 2021-10-26 ENCOUNTER — Other Ambulatory Visit: Payer: Self-pay

## 2021-10-26 NOTE — Telephone Encounter (Signed)
Scheduled appt per 7/5 referral. Pt is aware of appt date and time. Pt is aware to arrive 15 mins prior to appt time and to bring and updated insurance card. Pt is aware of appt location.   

## 2021-11-19 ENCOUNTER — Inpatient Hospital Stay: Payer: Medicaid Other | Attending: Hematology | Admitting: Hematology

## 2021-11-19 ENCOUNTER — Telehealth: Payer: Self-pay | Admitting: Nurse Practitioner

## 2021-11-19 DIAGNOSIS — D509 Iron deficiency anemia, unspecified: Secondary | ICD-10-CM | POA: Insufficient documentation

## 2021-11-19 DIAGNOSIS — E538 Deficiency of other specified B group vitamins: Secondary | ICD-10-CM | POA: Insufficient documentation

## 2021-11-19 DIAGNOSIS — Z8500 Personal history of malignant neoplasm of unspecified digestive organ: Secondary | ICD-10-CM | POA: Insufficient documentation

## 2021-11-19 NOTE — Telephone Encounter (Signed)
R/s pt's new hem appt. Pt is aware of new appt date/time.  

## 2021-11-24 NOTE — Progress Notes (Unsigned)
[place]   [phone] [phone]   Clinic New Consult Note   Patient Care Team: Pcp, No as PCP - General 11/24/2021  CHIEF COMPLAINTS/PURPOSE OF CONSULTATION:  Iron and B12 deficiency anemia, referred by PCP  HISTORY OF PRESENTING ILLNESS:  Evan Brown 56 y.o. male with PMH including ***and stomach cancer.  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  He underwent upper endoscopy 12/2018 by Dr. Watt Climes, I do not have the report or pathology but evidently showed adenocarcinoma of the gastric body and +H. pylori.  He underwent distal gastrectomy and omentectomy by Dr. Barry Dienes 03/19/2019, path showed well-differentiated adenocarcinoma spanning 1.8 cm limited to submucosa with negative margins and 0/13 negative lymph nodes.  He did not require adjuvant therapy and was not seen by medical oncology.  He developed fatigue, evaluated by PCP who referred him to ED for severe anemia 09/18/2021, Hgb 3.8, MCV 60, RDW 22, and platelet count elevated at 451.  B12 level low 146; iron panel with serum iron L11, TIBC 64, 2% saturation and ferritin of 2 consistent with B12 and iron deficiency anemia.  FOBT, folic acid, PT/aPTT were normal.  EGD 09/21/2021 showed a tiny hiatal hernia and gastritis; flex sigmoid with poor prep; colonoscopy showed internal and external hemorrhoids, diverticulosis, and 2 small to medium polyps in the sigmoid colon which were resected.  He was transfused a total of 5 units and received a B12 injection.  He was discharged home on oral B12 and iron, Hgb 7.7 on 6/7-day of discharge.  Follow-up at PCP and labs 10/20/2021 showed normocytic anemia with Hgb 11.5, normal platelet, normal B12 496, and improving iron panel. He was referred to hematology for further management of nutritional anemia.   MEDICAL HISTORY:  Past Medical History:  Diagnosis Date   Gastric  cancer pT1pN0 (0/12LN) s/p subtotal gastrectomy/B2 reconstruction 03/19/2019 03/19/2019    SURGICAL HISTORY: Past Surgical History:  Procedure Laterality Date   COLONOSCOPY N/A 09/22/2021   Procedure: COLONOSCOPY;  Surgeon: Clarene Essex, MD;  Location: [place];  Service: Gastroenterology;  Laterality: N/A;   ESOPHAGOGASTRODUODENOSCOPY (EGD) WITH PROPOFOL N/A 09/21/2021   Procedure: ESOPHAGOGASTRODUODENOSCOPY (EGD) WITH PROPOFOL;  Surgeon: Clarene Essex, MD;  Location: [place] [place];  Service: Gastroenterology;  Laterality: N/A;   FLEXIBLE SIGMOIDOSCOPY N/A 09/21/2021   Procedure: FLEXIBLE SIGMOIDOSCOPY;  Surgeon: Clarene Essex, MD;  Location: [place];  Service: Gastroenterology;  Laterality: N/A;   GASTROSTOMY N/A 03/19/2019   Procedure: INSERTION OF JEJUNOSTOMY TUBE;  Surgeon: [name], MD;  Location: [place];  Service: General;  Laterality: N/A;   LAPAROSCOPY N/A 03/19/2019   Procedure: LAPAROSCOPY DIAGNOSTIC;  Surgeon: [name], MD;  Location: [place];  Service: General;  Laterality: N/A;   PARTIAL GASTRECTOMY N/A 03/19/2019   Procedure: PARTIAL GASTRECTOMY;  Surgeon: [name], MD;  Location: [place];  Service: General;  Laterality: N/A;   POLYPECTOMY  09/22/2021   Procedure: POLYPECTOMY;  Surgeon: Clarene Essex, MD;  Location: [facility] ENDOSCOPY;  Service: Gastroenterology;;    SOCIAL HISTORY: Social History   Socioeconomic History   Marital status: Single    Spouse name: Not on file   Number of children: Not on file   Years of education: Not on file   Highest education level: Not on file  Occupational History   Not on file  Tobacco Use   Smoking status: Every Day    Packs/day: 0.50  Types: Cigarettes   Smokeless tobacco: Never  Vaping Use   Vaping Use: Never used  Substance and Sexual Activity   Alcohol use: Not Currently   Drug use: Yes    Types: Marijuana    Comment: occassionally    Sexual activity: Not on file  Other Topics Concern   Not on file  Social History  Narrative   Not on file   Social Determinants of Health   Financial Resource Strain: Not on file  Food Insecurity: Not on file  Transportation Needs: Not on file  Physical Activity: Not on file  Stress: Not on file  Social Connections: Not on file  Intimate Partner Violence: Not on file    FAMILY HISTORY: Family History  Problem Relation Age of Onset   Heart disease Neg Hx     ALLERGIES:  has No Known Allergies.  MEDICATIONS:  Current Outpatient Medications  Medication Sig Dispense Refill   acetaminophen (TYLENOL) 500 MG tablet Take 500 mg by mouth every 6 (six) hours as needed for moderate pain.     ferrous sulfate 325 (65 FE) MG tablet Take 1 tablet (325 mg total) by mouth 2 (two) times daily. 180 tablet 1   hydrocortisone (ANUSOL-HC) 2.5 % rectal cream Apply 1 application. topically 4 (four) times daily as needed for hemorrhoids. 30 g 0   pantoprazole (PROTONIX) 40 MG tablet Take 1 tablet (40 mg total) by mouth daily. 90 tablet 1   vitamin B-12 (CYANOCOBALAMIN) 1000 MCG tablet Take 1 tablet (1,000 mcg total) by mouth daily. 90 tablet 1   No current facility-administered medications for this visit.    REVIEW OF SYSTEMS:   Constitutional: Denies fevers, chills or abnormal night sweats Eyes: Denies blurriness of vision, double vision or watery eyes Ears, nose, mouth, throat, and face: Denies mucositis or sore throat Respiratory: Denies cough, dyspnea or wheezes Cardiovascular: Denies palpitation, chest discomfort or lower extremity swelling Gastrointestinal:  Denies nausea, heartburn or change in bowel habits Skin: Denies abnormal skin rashes Lymphatics: Denies new lymphadenopathy or easy bruising Neurological:Denies numbness, tingling or new weaknesses Behavioral/Psych: Mood is stable, no new changes  All other systems were reviewed with the patient and are negative.  PHYSICAL EXAMINATION: ECOG PERFORMANCE STATUS: {CHL ONC ECOG PS:[phone]}  There were no vitals  filed for this visit. There were no vitals filed for this visit.  GENERAL:alert, no distress and comfortable SKIN: skin color, texture, turgor are normal, no rashes or significant lesions EYES: normal, conjunctiva are pink and non-injected, sclera clear OROPHARYNX:no exudate, no erythema and lips, buccal mucosa, and tongue normal  NECK: supple, thyroid normal size, non-tender, without nodularity LYMPH:  no palpable lymphadenopathy in the cervical, axillary or inguinal LUNGS: clear to auscultation and percussion with normal breathing effort HEART: regular rate & rhythm and no murmurs and no lower extremity edema ABDOMEN:abdomen soft, non-tender and normal bowel sounds Musculoskeletal:no cyanosis of digits and no clubbing  PSYCH: alert & oriented x 3 with fluent speech NEURO: no focal motor/sensory deficits  LABORATORY DATA:  I have reviewed the data as listed    Latest Ref Rng & Units 10/20/2021    2:25 PM 09/22/2021    4:09 AM 09/21/2021    2:54 AM  CBC  WBC 3.4 - 10.8 x10E3/uL 8.5  9.7  8.8   Hemoglobin 13.0 - 17.7 g/dL 11.5  7.7  7.8   Hematocrit 37.5 - 51.0 % 38.8  26.7  27.0   Platelets 150 - 450 x10E3/uL 381  527  498     @  cmpl@  RADIOGRAPHIC STUDIES: I have personally reviewed the radiological images as listed and agreed with the findings in the report. No results found.  ASSESSMENT & PLAN:  *** No orders of the defined types were placed in this encounter.   All questions were answered. The patient knows to call the clinic with any problems, questions or concerns. I spent {CHL ONC TIME VISIT - FPULG:[id number]} counseling the patient face to face. The total time spent in the appointment was {CHL ONC TIME VISIT - CQPEA:[id number]} and more than 50% was on counseling.     Alla Feeling, NP 11/24/2021 1:05 PM

## 2021-11-25 ENCOUNTER — Other Ambulatory Visit: Payer: Self-pay

## 2021-11-25 ENCOUNTER — Inpatient Hospital Stay: Payer: Medicaid Other | Admitting: Nurse Practitioner

## 2021-11-25 ENCOUNTER — Inpatient Hospital Stay: Payer: Medicaid Other

## 2021-11-25 ENCOUNTER — Encounter: Payer: Self-pay | Admitting: Nurse Practitioner

## 2021-11-25 VITALS — BP 153/93 | HR 78 | Temp 97.5°F | Resp 19 | Wt 171.4 lb

## 2021-11-25 DIAGNOSIS — E538 Deficiency of other specified B group vitamins: Secondary | ICD-10-CM

## 2021-11-25 DIAGNOSIS — D509 Iron deficiency anemia, unspecified: Secondary | ICD-10-CM

## 2021-11-25 DIAGNOSIS — D649 Anemia, unspecified: Secondary | ICD-10-CM

## 2021-11-25 DIAGNOSIS — Z8500 Personal history of malignant neoplasm of unspecified digestive organ: Secondary | ICD-10-CM

## 2021-11-25 LAB — RETIC PANEL
Immature Retic Fract: 8.6 % (ref 2.3–15.9)
RBC.: 4.81 MIL/uL (ref 4.22–5.81)
Retic Count, Absolute: 44.3 10*3/uL (ref 19.0–186.0)
Retic Ct Pct: 0.9 % (ref 0.4–3.1)
Reticulocyte Hemoglobin: 33.5 pg (ref >27.9)

## 2021-11-25 LAB — CBC WITH DIFFERENTIAL (CANCER CENTER ONLY)
Abs Immature Granulocytes: 0.03 10*3/uL (ref 0.00–0.07)
Basophils Absolute: 0.1 10*3/uL (ref 0.0–0.1)
Basophils Relative: 1 %
Eosinophils Absolute: 0.1 10*3/uL (ref 0.0–0.5)
Eosinophils Relative: 1 %
HCT: 40 % (ref 39.0–52.0)
Hemoglobin: 12.9 g/dL — ABNORMAL LOW (ref 13.0–17.0)
Immature Granulocytes: 0 %
Lymphocytes Relative: 17 %
Lymphs Abs: 1.6 10*3/uL (ref 0.7–4.0)
MCH: 26.7 pg (ref 26.0–34.0)
MCHC: 32.3 g/dL (ref 30.0–36.0)
MCV: 82.6 fL (ref 80.0–100.0)
Monocytes Absolute: 0.5 10*3/uL (ref 0.1–1.0)
Monocytes Relative: 5 %
Neutro Abs: 7.3 10*3/uL (ref 1.7–7.7)
Neutrophils Relative %: 76 %
Platelet Count: 333 10*3/uL (ref 150–400)
RBC: 4.84 MIL/uL (ref 4.22–5.81)
RDW: 21.5 % — ABNORMAL HIGH (ref 11.5–15.5)
WBC Count: 9.6 10*3/uL (ref 4.0–10.5)
nRBC: 0 % (ref 0.0–0.2)

## 2021-11-25 LAB — IRON AND IRON BINDING CAPACITY (CC-WL,HP ONLY)
Iron: 185 ug/dL — ABNORMAL HIGH (ref 45–182)
Saturation Ratios: 35 % (ref 17.9–39.5)
TIBC: 535 ug/dL — ABNORMAL HIGH (ref 250–450)
UIBC: 350 ug/dL (ref 117–376)

## 2021-11-25 LAB — VITAMIN B12: Vitamin B-12: 468 pg/mL (ref 180–914)

## 2021-11-25 LAB — FERRITIN: Ferritin: 11 ng/mL — ABNORMAL LOW (ref 24–336)

## 2021-11-29 LAB — METHYLMALONIC ACID, SERUM: Methylmalonic Acid, Quantitative: 149 nmol/L (ref 0–378)

## 2021-12-04 ENCOUNTER — Other Ambulatory Visit (HOSPITAL_COMMUNITY): Payer: Self-pay

## 2021-12-07 ENCOUNTER — Telehealth: Payer: Self-pay | Admitting: Nurse Practitioner

## 2021-12-07 ENCOUNTER — Other Ambulatory Visit: Payer: Self-pay | Admitting: Nurse Practitioner

## 2021-12-07 NOTE — Telephone Encounter (Signed)
Per 8/22 in basket, tried to call but voicemail not set up, will try to call back

## 2021-12-14 ENCOUNTER — Telehealth: Payer: Self-pay

## 2021-12-14 ENCOUNTER — Inpatient Hospital Stay: Payer: Medicaid Other

## 2021-12-14 NOTE — Telephone Encounter (Signed)
This nurse left a message related to patient no showing for his Venofer infusion today.  This nurse asked for patient to give [place] a call in the clinic so that his appointment can be rescheduled if he would like to proceed with the infusion. This nurse will also send a message to My Chart with the same message.  No further concerns at this time.

## 2021-12-15 ENCOUNTER — Telehealth: Payer: Self-pay | Admitting: Nurse Practitioner

## 2021-12-15 NOTE — Telephone Encounter (Signed)
Contacted patient to scheduled appointments. Left message with appointment details and a call back number if patient had any questions or could not accommodate the time we provided.   

## 2021-12-18 ENCOUNTER — Encounter: Payer: Self-pay | Admitting: Nurse Practitioner

## 2021-12-18 ENCOUNTER — Other Ambulatory Visit (HOSPITAL_COMMUNITY): Payer: Self-pay

## 2021-12-21 ENCOUNTER — Other Ambulatory Visit (HOSPITAL_COMMUNITY): Payer: Self-pay

## 2021-12-22 ENCOUNTER — Other Ambulatory Visit (HOSPITAL_COMMUNITY): Payer: Self-pay

## 2021-12-24 ENCOUNTER — Other Ambulatory Visit (HOSPITAL_COMMUNITY): Payer: Self-pay

## 2021-12-25 ENCOUNTER — Other Ambulatory Visit (HOSPITAL_COMMUNITY): Payer: Self-pay

## 2021-12-27 ENCOUNTER — Inpatient Hospital Stay: Payer: Medicaid Other | Attending: Hematology

## 2021-12-27 ENCOUNTER — Other Ambulatory Visit: Payer: Self-pay

## 2021-12-27 DIAGNOSIS — D509 Iron deficiency anemia, unspecified: Secondary | ICD-10-CM | POA: Insufficient documentation

## 2021-12-28 ENCOUNTER — Encounter: Payer: Self-pay | Admitting: Nurse Practitioner

## 2021-12-28 ENCOUNTER — Other Ambulatory Visit (HOSPITAL_COMMUNITY): Payer: Self-pay

## 2021-12-29 ENCOUNTER — Ambulatory Visit: Payer: Medicaid Other | Attending: Physician Assistant | Admitting: Physician Assistant

## 2021-12-29 ENCOUNTER — Encounter: Payer: Self-pay | Admitting: Physician Assistant

## 2021-12-29 ENCOUNTER — Other Ambulatory Visit: Payer: Self-pay

## 2021-12-29 ENCOUNTER — Other Ambulatory Visit (HOSPITAL_COMMUNITY): Payer: Self-pay

## 2021-12-29 VITALS — BP 134/84 | HR 87 | Ht 75.0 in | Wt 165.8 lb

## 2021-12-29 DIAGNOSIS — Z85028 Personal history of other malignant neoplasm of stomach: Secondary | ICD-10-CM | POA: Diagnosis not present

## 2021-12-29 DIAGNOSIS — R739 Hyperglycemia, unspecified: Secondary | ICD-10-CM

## 2021-12-29 DIAGNOSIS — D649 Anemia, unspecified: Secondary | ICD-10-CM

## 2021-12-29 MED ORDER — PANTOPRAZOLE SODIUM 40 MG PO TBEC: 40.0000 mg | DELAYED_RELEASE_TABLET | Freq: Every day | ORAL | 1 refills | Status: DC

## 2021-12-29 MED ORDER — PANTOPRAZOLE SODIUM 40 MG PO TBEC
40.0000 mg | DELAYED_RELEASE_TABLET | Freq: Every day | ORAL | 1 refills | Status: DC
  Filled 2021-12-29 – 2022-04-15 (×2): qty 90, 90d supply, fill #0

## 2021-12-29 NOTE — Progress Notes (Signed)
Patient ID: Evan Brown, male   DOB: June 02, 1965, 56 y.o.   MRN: [id number]    Evan Brown, is a 56 y.o. male  XNT:[id number]  [id number]  DOB - [birth year]  Chief Complaint  Patient presents with   Medication Refill       Subjective:   Evan Brown is a 56 y.o. male here today for a follow up visit.  He is doing well.  He c/o polydipsia and wants to be checked for diabetes.  He denies polyuria.  Blood sugar in June =103.  He established with heme/ onc in august.  Still needs GI appt.  He says he and his sister can never get anyone on the phone there.  He denies alcohol use.  He says his energy levels are good.  Appetite is good.  No melena/hematochezia.  Has iron infusion tomorrow.  H/o GI cancer and s/p gastrectomy 2020   Seen by heme/onc 11/25/2021: ASSESSMENT & PLAN: 56 year old male   1.Iron and B12 deficiency anemia, secondary to gastrectomy and NSAID induced gastritis -we reviewed his medical record including lab and endoscopy in detail with the patient and his fiance. -He was taking 2 NSAIDs per day x 1 year for pain prophylaxis (he was working laborious jobs at the time), presented to ED with symptomatic anemia  -Workup 09/22/2021 showed microcytic anemia with Hgb 3.8, thrombocytosis, ferritin 2, iron 11, TIBC 624, and transferrin saturation 2%, folate low 146.  Immature retic % was elevated but absolute retic count normal. FOBT negative.  PT-INR and aPTT were normal.  No evidence of CKD -He was admitted 6/3 - 09/22/21, endoscopy by Dr. Watt Climes showed chronic gastritis; no bleeding source on colonoscopy.  He received 5 units blood transfusion, 1 B12 injection, and reportedly received an IV iron infusion but I do not see evidence of that on the inpatient flowsheet. -Repeat labs PCP 7/5 showed Hgb 11.5, normal MCV and platelet, normal B12 and folate, and improving iron panel (TIBC remained elevated 511 and ferritin of 20) -He has stopped taking NSAIDs and continues PPI since  hospitalization.  I encouraged him to separate oral iron and PPI by 2 hours and take iron with vitamin C to promote absorption. -Due to his previous gastrectomy he understands he may not absorb nutrients taken orally and may require long-term B12 injection and IV iron -For now he will continue oral iron and B12, we will obtain labs today for new baseline and set up B12 injection and IV iron if he is not maintaining levels on oral supplements -Continue lab every 2 months x 3, and follow-up in 6 months -Patient seen with Dr. Burr Medico   2. History of gastric cancer, pT1b N0 M0, grade I, stage IA -He developed abdominal pain and n/v in 10/2018, CT showed gastric wall thickening. I don't have records of diagnostic endoscopy or initial path -S/p subtotal gastrectomy with B2 reconstruction and J-tube placement by Dr. Barry Dienes 03/19/2019 -Path showed well-differentiated adenocarcinoma spanning 1.8 cm limited to submucosa, 0/13 + lymph nodes and clear margins -J tube removed after recovery and appropriate weight gain -not seen by oncology at the time   3. Polydipsia and Age-appropriate health maintenance -he reports excessive thirst, BG 79 - 130 during hospitalization.  -I recommend PCP f/up for hgb A1c and other age appropriate screenings  -I encouraged him to continue alcohol abstinence and continue to try to quit smoking, he is motivated     PLAN: -Medical record reviewed -Continue oral B12 and iron once daily, separate  from PPI by at least 2 hours and take iron with vit C -Lab today and q2 months x3; will set up B12 inj and IV iron if not able to maintain levels -F/up in 6 months  -F/up PCP and other routine care   No problems updated.  ALLERGIES: No Known Allergies  PAST MEDICAL HISTORY: Past Medical History:  Diagnosis Date   Anemia    Gastric cancer pT1pN0 (0/12LN) s/p subtotal gastrectomy/B2 reconstruction 03/19/2019 03/19/2019    MEDICATIONS AT HOME: Prior to Admission medications    Medication Sig Start Date End Date Taking? Authorizing Provider  acetaminophen (TYLENOL) 500 MG tablet Take 500 mg by mouth every 6 (six) hours as needed for moderate pain.   Yes [provider]  cyanocobalamin (VITAMIN B12) 1000 MCG tablet Take 1 tablet (1,000 mcg total) by mouth daily. 10/20/21  Yes Evan Donovan, PA-C  ferrous sulfate 325 (65 FE) MG tablet Take 1 tablet (325 mg total) by mouth 2 (two) times daily. 10/20/21 04/18/22 Yes Evan Brown, Evan Bucy, PA-C  hydrocortisone (ANUSOL-HC) 2.5 % rectal cream Apply 1 application. topically 4 (four) times daily as needed for hemorrhoids. Patient not taking: Reported on 12/29/2021 09/22/21   Mercy Riding, MD  pantoprazole (PROTONIX) 40 MG tablet Take 1 tablet (40 mg total) by mouth daily. 12/29/21   Evan Brown, Evan Bucy, PA-C    ROS: Neg HEENT Neg resp Neg GU Neg MS Neg psych Neg neuro  Objective:   Vitals:   12/29/21 1507 12/29/21 1524  BP: (!) 156/88 134/84  Pulse: 87   SpO2: 94%   Weight: 165 lb 12.8 oz (75.2 kg)   Height: '6\' 3"'$  (1.905 m)    Exam General appearance : Awake, alert, not in any distress. Speech Clear. Not toxic looking HEENT: Atraumatic and Normocephalic Neck: Supple, no JVD. No cervical lymphadenopathy.  Chest: Good air entry bilaterally, CTAB.  No rales/rhonchi/wheezing CVS: S1 S2 regular, no murmurs.  Extremities: B/L Lower Ext shows no edema, both legs are warm to touch Neurology: Awake alert, and oriented X 3, CN II-XII intact, Non focal Skin: No Rash  Data Review No results found for: "HGBA1C"  Assessment & Plan   1. Elevated blood sugar I have had a lengthy discussion and provided education about insulin resistance and the intake of too much sugar/refined carbohydrates.  I have advised the patient to work at a goal of eliminating sugary drinks, candy, desserts, sweets, refined sugars, processed foods, and white carbohydrates.  The patient expresses understanding.  -doubt diabetic.  Encouraged  adequate hydration-drink 80-100 ounces water daily - Hemoglobin A1c  2. Symptomatic iron deficiency anemia/acute on chronic blood loss anemia due to upper GI bleed Continue f/up heme onc and referral for h/o gastrectomy - Ambulatory referral to Gastroenterology - pantoprazole (PROTONIX) 40 MG tablet; Take 1 tablet (40 mg total) by mouth daily.  Dispense: 90 tablet; Refill: 1  3. History of gastric cancer - Ambulatory referral to Gastroenterology    Return in about 4 months (around 04/30/2022) for assign PCP.  The patient was given clear instructions to go to ER or return to medical center if symptoms don't improve, worsen or new problems develop. The patient verbalized understanding. The patient was told to call to get lab results if they haven't heard anything in the next week.      Evan Caldron, PA-C [facility] [place], [place]   12/29/2021, 3:30 PM

## 2021-12-30 ENCOUNTER — Other Ambulatory Visit: Payer: Self-pay

## 2021-12-30 ENCOUNTER — Inpatient Hospital Stay: Payer: Medicaid Other

## 2021-12-30 VITALS — BP 160/90 | HR 90 | Resp 16

## 2021-12-30 DIAGNOSIS — D649 Anemia, unspecified: Secondary | ICD-10-CM

## 2021-12-30 DIAGNOSIS — D509 Iron deficiency anemia, unspecified: Secondary | ICD-10-CM | POA: Diagnosis present

## 2021-12-30 LAB — HEMOGLOBIN A1C
Est. average glucose Bld gHb Est-mCnc: 114 mg/dL
Hgb A1c MFr Bld: 5.6 % (ref 4.8–5.6)

## 2021-12-30 MED ORDER — SODIUM CHLORIDE 0.9 % IV SOLN: Freq: Once | INTRAVENOUS | Status: AC

## 2021-12-30 MED ORDER — SODIUM CHLORIDE 0.9 % IV SOLN
400.0000 mg | Freq: Once | INTRAVENOUS | Status: AC
  Administered 2021-12-30: 400 mg via INTRAVENOUS
  Filled 2021-12-30: qty 20

## 2021-12-30 NOTE — Patient Instructions (Signed)

## 2022-01-04 ENCOUNTER — Encounter: Payer: Self-pay | Admitting: Nurse Practitioner

## 2022-01-04 ENCOUNTER — Other Ambulatory Visit (HOSPITAL_COMMUNITY): Payer: Self-pay

## 2022-01-25 ENCOUNTER — Other Ambulatory Visit: Payer: Medicaid Other

## 2022-01-25 ENCOUNTER — Inpatient Hospital Stay: Payer: Medicaid Other | Attending: Hematology

## 2022-03-28 ENCOUNTER — Inpatient Hospital Stay: Payer: Medicaid Other | Attending: Hematology

## 2022-03-28 ENCOUNTER — Other Ambulatory Visit: Payer: Self-pay

## 2022-03-28 DIAGNOSIS — D509 Iron deficiency anemia, unspecified: Secondary | ICD-10-CM | POA: Insufficient documentation

## 2022-03-28 DIAGNOSIS — D649 Anemia, unspecified: Secondary | ICD-10-CM

## 2022-03-28 DIAGNOSIS — E538 Deficiency of other specified B group vitamins: Secondary | ICD-10-CM

## 2022-03-28 LAB — CBC WITH DIFFERENTIAL (CANCER CENTER ONLY)
Abs Immature Granulocytes: 0.13 10*3/uL — ABNORMAL HIGH (ref 0.00–0.07)
Basophils Absolute: 0.1 10*3/uL (ref 0.0–0.1)
Basophils Relative: 1 %
Eosinophils Absolute: 0.2 10*3/uL (ref 0.0–0.5)
Eosinophils Relative: 2 %
HCT: 48.6 % (ref 39.0–52.0)
Hemoglobin: 16 g/dL (ref 13.0–17.0)
Immature Granulocytes: 1 %
Lymphocytes Relative: 14 %
Lymphs Abs: 1.4 10*3/uL (ref 0.7–4.0)
MCH: 30.7 pg (ref 26.0–34.0)
MCHC: 32.9 g/dL (ref 30.0–36.0)
MCV: 93.1 fL (ref 80.0–100.0)
Monocytes Absolute: 0.7 10*3/uL (ref 0.1–1.0)
Monocytes Relative: 7 %
Neutro Abs: 7.4 10*3/uL (ref 1.7–7.7)
Neutrophils Relative %: 75 %
Platelet Count: 351 10*3/uL (ref 150–400)
RBC: 5.22 MIL/uL (ref 4.22–5.81)
RDW: 13.8 % (ref 11.5–15.5)
WBC Count: 9.9 10*3/uL (ref 4.0–10.5)
nRBC: 0 % (ref 0.0–0.2)

## 2022-03-28 LAB — RETIC PANEL
Immature Retic Fract: 5.7 % (ref 2.3–15.9)
RBC.: 5.2 MIL/uL (ref 4.22–5.81)
Retic Count, Absolute: 81.1 10*3/uL (ref 19.0–186.0)
Retic Ct Pct: 1.6 % (ref 0.4–3.1)
Reticulocyte Hemoglobin: 33.7 pg (ref >27.9)

## 2022-03-28 LAB — CMP (CANCER CENTER ONLY)
ALT: 11 U/L (ref 0–44)
AST: 12 U/L — ABNORMAL LOW (ref 15–41)
Albumin: 3.9 g/dL (ref 3.5–5.0)
Alkaline Phosphatase: 110 U/L (ref 38–126)
Anion gap: 5 (ref 5–15)
BUN: 18 mg/dL (ref 6–20)
CO2: 26 mmol/L (ref 22–32)
Calcium: 9.8 mg/dL (ref 8.9–10.3)
Chloride: 109 mmol/L (ref 98–111)
Creatinine: 0.91 mg/dL (ref 0.61–1.24)
GFR, Estimated: >60 mL/min (ref >60)
Glucose, Bld: 130 mg/dL — ABNORMAL HIGH (ref 70–99)
Potassium: 3.9 mmol/L (ref 3.5–5.1)
Sodium: 140 mmol/L (ref 135–145)
Total Bilirubin: 0.3 mg/dL (ref 0.3–1.2)
Total Protein: 6.8 g/dL (ref 6.5–8.1)

## 2022-03-28 LAB — IRON AND IRON BINDING CAPACITY (CC-WL,HP ONLY)
Iron: 56 ug/dL (ref 45–182)
Saturation Ratios: 13 % — ABNORMAL LOW (ref 17.9–39.5)
TIBC: 449 ug/dL (ref 250–450)
UIBC: 393 ug/dL — ABNORMAL HIGH (ref 117–376)

## 2022-03-28 LAB — FERRITIN: Ferritin: 22 ng/mL — ABNORMAL LOW (ref 24–336)

## 2022-03-28 LAB — VITAMIN B12: Vitamin B-12: 526 pg/mL (ref 180–914)

## 2022-04-01 LAB — METHYLMALONIC ACID, SERUM: Methylmalonic Acid, Quantitative: 161 nmol/L (ref 0–378)

## 2022-04-15 ENCOUNTER — Other Ambulatory Visit: Payer: Self-pay

## 2022-04-15 ENCOUNTER — Other Ambulatory Visit (HOSPITAL_COMMUNITY): Payer: Self-pay

## 2022-04-25 ENCOUNTER — Encounter: Payer: Self-pay | Admitting: Nurse Practitioner

## 2022-04-25 ENCOUNTER — Other Ambulatory Visit: Payer: Self-pay

## 2022-04-25 ENCOUNTER — Other Ambulatory Visit (HOSPITAL_COMMUNITY): Payer: Self-pay

## 2022-05-03 ENCOUNTER — Ambulatory Visit: Payer: Medicaid Other | Admitting: Critical Care Medicine

## 2022-05-11 ENCOUNTER — Other Ambulatory Visit (HOSPITAL_COMMUNITY): Payer: Self-pay

## 2022-05-15 NOTE — Progress Notes (Signed)
Established Patient Office Visit  Subjective   Patient ID: Evan Brown, male    DOB: 01-31-1966  Age: 57 y.o. MRN: [id number]  Chief Complaint  Patient presents with   Anemia    This is a new patient to the practice he had been seen twice once in July and again in September by PA Mcclung.  Below is documentation from that visit and the hematology visits in August. Patient has a history of gastritis status post gastrectomy for stomach cancer severe iron deficiency anemia and blood loss anemia.  Hospitalization in June showed severe microcytic anemia hemoglobin of 3.8 he got 5 units of blood with that admission.  He is not drinking alcohol but he is still heavily smoking half a pack a day.  He does have Medicaid.  On arrival blood pressure is good 125/80.  He is here accompanied by his spouse. Below are prior documentations New patient .  Seen 12/2021 by Sharon Seller:  Evan Brown is a 58 y.o. male here today for a follow up visit.  He is doing well.  He c/o polydipsia and wants to be checked for diabetes.  He denies polyuria.  Blood sugar in June =103.  He established with heme/ onc in august.  Still needs GI appt.  He says he and his sister can never get anyone on the phone there.  He denies alcohol use.  He says his energy levels are good.  Appetite is good.  No melena/hematochezia.  Has iron infusion tomorrow.  H/o GI cancer and s/p gastrectomy 2020   Seen by heme/onc 11/25/2021: ASSESSMENT & PLAN: 57 year old male   1.Iron and B12 deficiency anemia, secondary to gastrectomy and NSAID induced gastritis -we reviewed his medical record including lab and endoscopy in detail with the patient and his fiance. -He was taking 2 NSAIDs per day x 1 year for pain prophylaxis (he was working laborious jobs at the time), presented to ED with symptomatic anemia  -Workup 09/22/2021 showed microcytic anemia with Hgb 3.8, thrombocytosis, ferritin 2, iron 11, TIBC 624, and transferrin saturation 2%, folate low  146.  Immature retic % was elevated but absolute retic count normal. FOBT negative.  PT-INR and aPTT were normal.  No evidence of CKD -He was admitted 6/3 - 09/22/21, endoscopy by Dr. Ewing Schlein showed chronic gastritis; no bleeding source on colonoscopy.  He received 5 units blood transfusion, 1 B12 injection, and reportedly received an IV iron infusion but I do not see evidence of that on the inpatient flowsheet. -Repeat labs PCP 7/5 showed Hgb 11.5, normal MCV and platelet, normal B12 and folate, and improving iron panel (TIBC remained elevated 511 and ferritin of 20) -He has stopped taking NSAIDs and continues PPI since hospitalization.  I encouraged him to separate oral iron and PPI by 2 hours and take iron with vitamin C to promote absorption. -Due to his previous gastrectomy he understands he may not absorb nutrients taken orally and may require long-term B12 injection and IV iron -For now he will continue oral iron and B12, we will obtain labs today for new baseline and set up B12 injection and IV iron if he is not maintaining levels on oral supplements -Continue lab every 2 months x 3, and follow-up in 6 months -Patient seen with Dr. Mosetta Putt   2. History of gastric cancer, pT1b N0 M0, grade I, stage IA -He developed abdominal pain and n/v in 10/2018, CT showed gastric wall thickening. I don't have records of diagnostic endoscopy or initial  path -S/p subtotal gastrectomy with B2 reconstruction and J-tube placement by Dr. 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 Polydipsia and Age-appropriate health maintenance -he reports excessive thirst, BG 79 - 130 during hospitalization.  -I recommend PCP f/up for hgb A1c and other age appropriate screenings  -I encouraged him to continue alcohol abstinence and continue  to try to quit smoking, he is motivated     PLAN: -Medical record reviewed -Continue oral B12 and iron once daily, separate from PPI by at least 2 hours and take iron with vit C -Lab today and q2 months x3; will set up B12 inj and IV iron if not able to maintain levels   The patient needs follow-up lab this visit patient declines flu vaccine patient does need a hepatitis C assay patient does complain of chronic neck pain      Review of Systems  Constitutional:  Negative for chills, diaphoresis, fever, malaise/fatigue and weight loss.  HENT:  Negative for congestion, hearing loss, nosebleeds, sore throat and tinnitus.   Eyes:  Negative for blurred vision, photophobia and redness.  Respiratory:  Negative for cough, hemoptysis, sputum production, shortness of breath, wheezing and stridor.   Cardiovascular:  Negative for chest pain, palpitations, orthopnea, claudication, leg swelling and PND.  Gastrointestinal:  Negative for abdominal pain, blood in stool, constipation, diarrhea, heartburn, nausea and vomiting.  Genitourinary:  Negative for dysuria, flank pain, frequency, hematuria and urgency.  Musculoskeletal:  Positive for myalgias and neck pain. Negative for back pain, falls and joint pain.  Skin:  Negative for itching and rash.  Neurological:  Negative for dizziness, tingling, tremors, sensory change, speech change, focal weakness, seizures, loss of consciousness, weakness and headaches.  Endo/Heme/Allergies:  Negative for environmental allergies and polydipsia. Does not bruise/bleed easily.  Psychiatric/Behavioral:  Negative for depression, memory loss, substance abuse and suicidal ideas. The patient is not nervous/anxious and does not have insomnia.       Objective:     BP 125/80   Pulse 86   Temp 98.6 F (37 C)   Wt 177 lb 6.4 oz (80.5 kg)   SpO2 98%   BMI 22.17 kg/m    Physical Exam Vitals reviewed.  Constitutional:      Appearance: Normal appearance. He is  well-developed. He is not diaphoretic.  HENT:     Head: Normocephalic and atraumatic.     Nose: No nasal deformity, septal deviation, mucosal edema or rhinorrhea.     Right Sinus: No maxillary sinus tenderness or frontal sinus tenderness.     Left Sinus: No maxillary sinus tenderness or frontal sinus tenderness.     Mouth/Throat:     Pharynx: No oropharyngeal exudate.  Eyes:     General: No scleral icterus.    Conjunctiva/sclera: Conjunctivae normal.     Pupils: Pupils are equal, round, and reactive to light.  Neck:     Thyroid: No thyromegaly.     Vascular: No carotid bruit or JVD.     Trachea: Trachea normal. No tracheal tenderness or tracheal deviation.  Cardiovascular:     Rate and Rhythm: Normal rate and regular rhythm.     Chest Wall: PMI is not displaced.     Pulses: Normal pulses. No decreased pulses.     Heart sounds: Normal heart sounds, S1 normal and S2 normal. Heart sounds  not distant. No murmur heard.    No systolic murmur is present.     No diastolic murmur is present.     No friction rub. No gallop. No S3 or S4 sounds.  Pulmonary:     Effort: No tachypnea, accessory muscle usage or respiratory distress.     Breath sounds: No stridor. No decreased breath sounds, wheezing, rhonchi or rales.  Chest:     Chest wall: No tenderness.  Abdominal:     General: Bowel sounds are normal. There is no distension.     Palpations: Abdomen is soft. Abdomen is not rigid.     Tenderness: There is no abdominal tenderness. There is no guarding or rebound.  Musculoskeletal:        General: Tenderness present. Normal range of motion.     Cervical back: Normal range of motion and neck supple. No edema, erythema or rigidity. No muscular tenderness. Normal range of motion.     Comments: Trapezius muscles very tight and spastic  Lymphadenopathy:     Head:     Right side of head: No submental or submandibular adenopathy.     Left side of head: No submental or submandibular adenopathy.      Cervical: No cervical adenopathy.  Skin:    General: Skin is warm and dry.     Coloration: Skin is not pale.     Findings: No rash.     Nails: There is no clubbing.  Neurological:     Mental Status: He is alert and oriented to person, place, and time.     Sensory: No sensory deficit.  Psychiatric:        Speech: Speech normal.        Behavior: Behavior normal.      No results found for any visits on 05/17/22.    The ASCVD Risk score (Arnett DK, et al., 2019) failed to calculate for the following reasons:   Cannot find a previous HDL lab   Cannot find a previous total cholesterol lab    Assessment & Plan:   Problem List Items Addressed This Visit       Digestive   Gastric cancer pT1pN0 (0/12LN) s/p subtotal gastrectomy/B2 reconstruction 03/19/2019    Care per oncology      Chronic gastritis    Continue with proton pump inhibitor reassess CBC        Hematopoietic and Hemostatic   Thrombocytosis    Follow-up CBC      Relevant Medications   ferrous sulfate 325 (65 FE) MG tablet   Other Relevant Orders   CBC with Differential/Platelet     Other   Malnutrition of moderate degree - Primary    Reassess albumin      Relevant Orders   Comprehensive metabolic panel   Anemia, chronic disease    Continue iron supplement and B12 follow-up labs      Relevant Medications   cyanocobalamin (VITAMIN B12) 1000 MCG tablet   ferrous sulfate 325 (65 FE) MG tablet   Symptomatic iron deficiency anemia/acute on chronic blood loss anemia due to upper GI bleed   Relevant Medications   cyanocobalamin (VITAMIN B12) 1000 MCG tablet   ferrous sulfate 325 (65 FE) MG tablet   pantoprazole (PROTONIX) 40 MG tablet   Other Relevant Orders   CBC with Differential/Platelet   Iron, TIBC and Ferritin Panel   Nicotine dependence, cigarettes, uncomplicated       Current smoking consumption amount: 1/2 pack a day  Dicsussion on advise  to quit smoking and smoking impacts: Cancer  impacts  Patient's willingness to quit: Wants to quit  Methods to quit smoking discussed: Nicotine replacement  Medication management of smoking session drugs discussed: Nicotine lozenge  Resources provided:  AVS   Setting quit date not established  Follow-up arranged 3 months   Time spent counseling the patient:   5 minutes      Relevant Medications   nicotine polacrilex (NICORETTE MINI) 4 MG lozenge   Vitamin B12 deficiency   Relevant Medications   cyanocobalamin (VITAMIN B12) 1000 MCG tablet   Other Visit Diagnoses     Need for hepatitis C screening test       Relevant Orders   HCV Ab w Reflex to Quant PCR      40 minutes spent multisystems assessed establishing care Return in about 4 months (around 09/15/2022) for chronic conditions, back pain.    Shan Levans, MD

## 2022-05-17 ENCOUNTER — Encounter: Payer: Self-pay | Admitting: Critical Care Medicine

## 2022-05-17 ENCOUNTER — Ambulatory Visit: Payer: Medicaid Other | Attending: Critical Care Medicine | Admitting: Critical Care Medicine

## 2022-05-17 VITALS — BP 125/80 | HR 86 | Temp 98.6°F | Wt 177.4 lb

## 2022-05-17 DIAGNOSIS — C169 Malignant neoplasm of stomach, unspecified: Secondary | ICD-10-CM | POA: Diagnosis not present

## 2022-05-17 DIAGNOSIS — D62 Acute posthemorrhagic anemia: Secondary | ICD-10-CM | POA: Diagnosis not present

## 2022-05-17 DIAGNOSIS — E440 Moderate protein-calorie malnutrition: Secondary | ICD-10-CM | POA: Insufficient documentation

## 2022-05-17 DIAGNOSIS — K2950 Unspecified chronic gastritis without bleeding: Secondary | ICD-10-CM | POA: Insufficient documentation

## 2022-05-17 DIAGNOSIS — D509 Iron deficiency anemia, unspecified: Secondary | ICD-10-CM | POA: Diagnosis not present

## 2022-05-17 DIAGNOSIS — Z1159 Encounter for screening for other viral diseases: Secondary | ICD-10-CM

## 2022-05-17 DIAGNOSIS — Z79899 Other long term (current) drug therapy: Secondary | ICD-10-CM | POA: Diagnosis not present

## 2022-05-17 DIAGNOSIS — E538 Deficiency of other specified B group vitamins: Secondary | ICD-10-CM

## 2022-05-17 DIAGNOSIS — D75839 Thrombocytosis, unspecified: Secondary | ICD-10-CM | POA: Diagnosis not present

## 2022-05-17 DIAGNOSIS — D649 Anemia, unspecified: Secondary | ICD-10-CM

## 2022-05-17 DIAGNOSIS — F17210 Nicotine dependence, cigarettes, uncomplicated: Secondary | ICD-10-CM

## 2022-05-17 DIAGNOSIS — D638 Anemia in other chronic diseases classified elsewhere: Secondary | ICD-10-CM

## 2022-05-17 MED ORDER — FERROUS SULFATE 325 (65 FE) MG PO TABS: 325.0000 mg | ORAL_TABLET | Freq: Two times a day (BID) | ORAL | 1 refills | Status: DC

## 2022-05-17 MED ORDER — NICOTINE POLACRILEX 4 MG MT LOZG: LOZENGE | OROMUCOSAL | 4 refills | Status: AC

## 2022-05-17 MED ORDER — METHOCARBAMOL 500 MG PO TABS: 500.0000 mg | ORAL_TABLET | Freq: Four times a day (QID) | ORAL | 2 refills | Status: DC | PRN

## 2022-05-17 MED ORDER — VITAMIN B-12 1000 MCG PO TABS: 1000.0000 ug | ORAL_TABLET | Freq: Every day | ORAL | 1 refills | Status: DC

## 2022-05-17 MED ORDER — PANTOPRAZOLE SODIUM 40 MG PO TBEC: 40.0000 mg | DELAYED_RELEASE_TABLET | Freq: Every day | ORAL | 1 refills | Status: DC

## 2022-05-17 NOTE — Assessment & Plan Note (Signed)
Care per oncology

## 2022-05-17 NOTE — Assessment & Plan Note (Signed)
Reassess albumin

## 2022-05-17 NOTE — Assessment & Plan Note (Signed)
Continue iron supplement and B12 follow-up labs

## 2022-05-17 NOTE — Assessment & Plan Note (Signed)
    Current smoking consumption amount: 1/2 pack a day  Dicsussion on advise to quit smoking and smoking impacts: Cancer impacts  Patient's willingness to quit: Wants to quit  Methods to quit smoking discussed: Nicotine replacement  Medication management of smoking session drugs discussed: Nicotine lozenge  Resources provided:  AVS   Setting quit date not established  Follow-up arranged 3 months   Time spent counseling the patient:   5 minutes

## 2022-05-17 NOTE — Assessment & Plan Note (Signed)
Continue with proton pump inhibitor reassess CBC

## 2022-05-17 NOTE — Patient Instructions (Signed)
Muscle relaxant sent to your pharmacy and do back exercises as attached for your neck pain  Complete set of labs obtained at this visit  Stop smoking use nicotine lozenge see attachment  Refills on all medications sent to your pharmacy  Return to Dr. Joya Gaskins 4 months

## 2022-05-17 NOTE — Assessment & Plan Note (Signed)
Follow-up CBC ?

## 2022-05-18 LAB — CBC WITH DIFFERENTIAL/PLATELET
Basophils Absolute: 0.1 10*3/uL (ref 0.0–0.2)
Basos: 0 %
EOS (ABSOLUTE): 0.1 10*3/uL (ref 0.0–0.4)
Eos: 1 %
Hematocrit: 48.7 % (ref 37.5–51.0)
Hemoglobin: 15.9 g/dL (ref 13.0–17.7)
Immature Grans (Abs): 0 10*3/uL (ref 0.0–0.1)
Immature Granulocytes: 0 %
Lymphocytes Absolute: 1.1 10*3/uL (ref 0.7–3.1)
Lymphs: 6 %
MCH: 30.5 pg (ref 26.6–33.0)
MCHC: 32.6 g/dL (ref 31.5–35.7)
MCV: 94 fL (ref 79–97)
Monocytes Absolute: 0.8 10*3/uL (ref 0.1–0.9)
Monocytes: 5 %
Neutrophils Absolute: 15.5 10*3/uL — ABNORMAL HIGH (ref 1.4–7.0)
Neutrophils: 88 %
Platelets: 358 10*3/uL (ref 150–450)
RBC: 5.21 x10E6/uL (ref 4.14–5.80)
RDW: 11.9 % (ref 11.6–15.4)
WBC: 17.8 10*3/uL — ABNORMAL HIGH (ref 3.4–10.8)

## 2022-05-18 LAB — IRON,TIBC AND FERRITIN PANEL
Ferritin: 37 ng/mL (ref 30–400)
Iron Saturation: 19 % (ref 15–55)
Iron: 76 ug/dL (ref 38–169)
Total Iron Binding Capacity: 403 ug/dL (ref 250–450)
UIBC: 327 ug/dL (ref 111–343)

## 2022-05-18 LAB — COMPREHENSIVE METABOLIC PANEL
ALT: 15 IU/L (ref 0–44)
AST: 13 IU/L (ref 0–40)
Albumin/Globulin Ratio: 1.9 (ref 1.2–2.2)
Albumin: 4.2 g/dL (ref 3.8–4.9)
Alkaline Phosphatase: 148 IU/L — ABNORMAL HIGH (ref 44–121)
BUN/Creatinine Ratio: 11 (ref 9–20)
BUN: 10 mg/dL (ref 6–24)
Bilirubin Total: 0.3 mg/dL (ref 0.0–1.2)
CO2: 24 mmol/L (ref 20–29)
Calcium: 9.5 mg/dL (ref 8.7–10.2)
Chloride: 105 mmol/L (ref 96–106)
Creatinine, Ser: 0.91 mg/dL (ref 0.76–1.27)
Globulin, Total: 2.2 g/dL (ref 1.5–4.5)
Glucose: 84 mg/dL (ref 70–99)
Potassium: 4.3 mmol/L (ref 3.5–5.2)
Sodium: 143 mmol/L (ref 134–144)
Total Protein: 6.4 g/dL (ref 6.0–8.5)
eGFR: 98 mL/min/{1.73_m2} (ref >59)

## 2022-05-18 LAB — HCV AB W REFLEX TO QUANT PCR: HCV Ab: NONREACTIVE

## 2022-05-18 LAB — HCV INTERPRETATION

## 2022-05-18 NOTE — Progress Notes (Signed)
Let pt know all labs normal  iron levels normal no more anemia, hep C neg

## 2022-05-19 ENCOUNTER — Telehealth: Payer: Self-pay

## 2022-05-19 NOTE — Telephone Encounter (Signed)
Pt was called and is aware of results, DOB was confirmed.  ?

## 2022-05-19 NOTE — Telephone Encounter (Signed)
-----  Message from Elsie Stain, MD sent at 05/18/2022  8:24 AM EST ----- Let pt know all labs normal  iron levels normal no more anemia, hep C neg

## 2022-05-29 ENCOUNTER — Other Ambulatory Visit: Payer: Self-pay | Admitting: Nurse Practitioner

## 2022-05-29 DIAGNOSIS — D649 Anemia, unspecified: Secondary | ICD-10-CM

## 2022-05-29 DIAGNOSIS — E538 Deficiency of other specified B group vitamins: Secondary | ICD-10-CM

## 2022-05-29 NOTE — Progress Notes (Deleted)
Patient Care Team: Elsie Stain, MD as PCP - General (Pulmonary Disease)   CHIEF COMPLAINT: Follow up iron and B212 deficiency anemia and h/o stage I gastric cancer   Oncology History  Gastric cancer pT1pN0 (0/12LN) s/p subtotal gastrectomy/B2 reconstruction 03/19/2019  03/19/2019 Initial Diagnosis   Gastric cancer pT1pN0 (0/12LN) s/p subtotal gastrectomy/B2 reconstruction 03/19/2019   03/19/2019 Cancer Staging   Staging form: Stomach, AJCC 8th Edition - Pathologic stage from 03/19/2019: Stage IA (pT1b, pN0, cM0) - Signed by Alla Feeling, NP on 11/25/2021 Total positive nodes: 0 Histologic grade (G): G1 Histologic grading system: 3 grade system      CURRENT THERAPY:   INTERVAL HISTORY Mr. Mergel returns for follow up as scheduled. Last seen by [place] 11/25/21. He continues oral B12 and iron, received IV venofer 400 mg on 12/30/21. Anemia resolved after that, last lab 05/17/22 showed normal hgb, ferritin, and iron/TIBC panel.   ROS   Past Medical History:  Diagnosis Date   Anemia    Gastric cancer pT1pN0 (0/12LN) s/p subtotal gastrectomy/B2 reconstruction 03/19/2019 03/19/2019     Past Surgical History:  Procedure Laterality Date   COLONOSCOPY N/A 09/22/2021   Procedure: COLONOSCOPY;  Surgeon: Clarene Essex, MD;  Location: [place];  Service: Gastroenterology;  Laterality: N/A;   ESOPHAGOGASTRODUODENOSCOPY (EGD) WITH PROPOFOL N/A 09/21/2021   Procedure: ESOPHAGOGASTRODUODENOSCOPY (EGD) WITH PROPOFOL;  Surgeon: Clarene Essex, MD;  Location: [place];  Service: Gastroenterology;  Laterality: N/A;   FLEXIBLE SIGMOIDOSCOPY N/A 09/21/2021   Procedure: FLEXIBLE SIGMOIDOSCOPY;  Surgeon: Clarene Essex, MD;  Location: [place];  Service: Gastroenterology;  Laterality: N/A;   GASTROSTOMY N/A 03/19/2019   Procedure: INSERTION OF JEJUNOSTOMY TUBE;  Surgeon: [name], MD;  Location: [place];  Service: General;  Laterality: N/A;   LAPAROSCOPY N/A 03/19/2019   Procedure: LAPAROSCOPY  DIAGNOSTIC;  Surgeon: [name], MD;  Location: [place];  Service: General;  Laterality: N/A;   PARTIAL GASTRECTOMY N/A 03/19/2019   Procedure: PARTIAL GASTRECTOMY;  Surgeon: [name], MD;  Location: [place];  Service: General;  Laterality: N/A;   POLYPECTOMY  09/22/2021   Procedure: POLYPECTOMY;  Surgeon: Clarene Essex, MD;  Location: [place];  Service: Gastroenterology;;     Outpatient Encounter Medications as of 06/01/2022  Medication Sig   acetaminophen (TYLENOL) 500 MG tablet Take 500 mg by mouth every 6 (six) hours as needed for moderate pain.   cyanocobalamin (VITAMIN B12) 1000 MCG tablet Take 1 tablet (1,000 mcg total) by mouth daily.   ferrous sulfate 325 (65 FE) MG tablet Take 1 tablet (325 mg total) by mouth 2 (two) times daily.   methocarbamol (ROBAXIN) 500 MG tablet Take 1 tablet (500 mg total) by mouth every 6 (six) hours as needed for muscle spasms.   nicotine polacrilex (NICORETTE MINI) 4 MG lozenge Use three times daily to quit smoking   pantoprazole (PROTONIX) 40 MG tablet Take 1 tablet (40 mg total) by mouth daily.   No facility-administered encounter medications on file as of 06/01/2022.     There were no vitals filed for this visit. There is no height or weight on file to calculate BMI.   PHYSICAL EXAM GENERAL:alert, no distress and comfortable SKIN: no rash  EYES: sclera clear NECK: without mass LYMPH:  no palpable cervical or supraclavicular lymphadenopathy  LUNGS: clear with normal breathing effort HEART: regular rate & rhythm, no lower extremity edema ABDOMEN: abdomen soft, non-tender and normal bowel sounds NEURO: alert & oriented x 3 with fluent speech, no focal  motor/sensory deficits Breast exam:  PAC without erythema    CBC    Component Value Date/Time   WBC 17.8 (H) 05/17/2022 1028   WBC 9.9 03/28/2022 1427   WBC 9.7 09/22/2021 0409   RBC 5.21 05/17/2022 1028   RBC 5.22 03/28/2022 1427   RBC 5.20 03/28/2022 1427   HGB 15.9 05/17/2022 1028    HCT 48.7 05/17/2022 1028   PLT 358 05/17/2022 1028   MCV 94 05/17/2022 1028   MCH 30.5 05/17/2022 1028   MCH 30.7 03/28/2022 1427   MCHC 32.6 05/17/2022 1028   MCHC 32.9 03/28/2022 1427   RDW 11.9 05/17/2022 1028   LYMPHSABS 1.1 05/17/2022 1028   MONOABS 0.7 03/28/2022 1427   EOSABS 0.1 05/17/2022 1028   BASOSABS 0.1 05/17/2022 1028     CMP     Component Value Date/Time   NA 143 05/17/2022 1028   K 4.3 05/17/2022 1028   CL 105 05/17/2022 1028   CO2 24 05/17/2022 1028   GLUCOSE 84 05/17/2022 1028   GLUCOSE 130 (H) 03/28/2022 1427   BUN 10 05/17/2022 1028   CREATININE 0.91 05/17/2022 1028   CREATININE 0.91 03/28/2022 1427   CALCIUM 9.5 05/17/2022 1028   PROT 6.4 05/17/2022 1028   ALBUMIN 4.2 05/17/2022 1028   AST 13 05/17/2022 1028   AST 12 (L) 03/28/2022 1427   ALT 15 05/17/2022 1028   ALT 11 03/28/2022 1427   ALKPHOS 148 (H) 05/17/2022 1028   BILITOT 0.3 05/17/2022 1028   BILITOT 0.3 03/28/2022 1427   GFRNONAA >60 03/28/2022 1427   GFRAA >60 03/24/2019 0332     ASSESSMENT & PLAN:  PLAN:  No orders of the defined types were placed in this encounter.     All questions were answered. The patient knows to call the clinic with any problems, questions or concerns. No barriers to learning were detected. I spent *** counseling the patient face to face. The total time spent in the appointment was *** and more than 50% was on counseling, review of test results, and coordination of care.   Cira Rue, NP-C @DATE$ @

## 2022-06-01 ENCOUNTER — Inpatient Hospital Stay: Payer: Medicaid Other | Attending: Hematology | Admitting: Nurse Practitioner

## 2022-06-01 ENCOUNTER — Inpatient Hospital Stay: Payer: Medicaid Other

## 2022-09-11 NOTE — Progress Notes (Unsigned)
Established Patient Office Visit  Subjective   Patient ID: Evan Brown, male    DOB: 04/23/1965  Age: 57 y.o. MRN: [id number]  No chief complaint on file.    05/17/22 This is a new patient to the practice he had been seen twice once in July and again in September by PA Mcclung.  Below is documentation from that visit and the hematology visits in August. Patient has a history of gastritis status post gastrectomy for stomach cancer severe iron deficiency anemia and blood loss anemia.  Hospitalization in June showed severe microcytic anemia hemoglobin of 3.8 he got 5 units of blood with that admission.  He is not drinking alcohol but he is still heavily smoking half a pack a day.  He does have Medicaid.  On arrival blood pressure is good 125/80.  He is here accompanied by his spouse. Below are prior documentations New patient .  Seen 12/2021 by Sharon Seller:  Evan Brown is a 57 y.o. male here today for a follow up visit.  He is doing well.  He c/o polydipsia and wants to be checked for diabetes.  He denies polyuria.  Blood sugar in June =103.  He established with heme/ onc in august.  Still needs GI appt.  He says he and his sister can never get anyone on the phone there.  He denies alcohol use.  He says his energy levels are good.  Appetite is good.  No melena/hematochezia.  Has iron infusion tomorrow.  H/o GI cancer and s/p gastrectomy 2020   Seen by heme/onc 11/25/2021: ASSESSMENT & PLAN: 57 year old male   1.Iron and B12 deficiency anemia, secondary to gastrectomy and NSAID induced gastritis -we reviewed his medical record including lab and endoscopy in detail with the patient and his fiance. -He was taking 2 NSAIDs per day x 1 year for pain prophylaxis (he was working laborious jobs at the time), presented to ED with symptomatic anemia  -Workup 09/22/2021 showed microcytic anemia with Hgb 3.8, thrombocytosis, ferritin 2, iron 11, TIBC 624, and transferrin saturation 2%, folate low 146.   Immature retic % was elevated but absolute retic count normal. FOBT negative.  PT-INR and aPTT were normal.  No evidence of CKD -He was admitted 6/3 - 09/22/21, endoscopy by Dr. Ewing Schlein showed chronic gastritis; no bleeding source on colonoscopy.  He received 5 units blood transfusion, 1 B12 injection, and reportedly received an IV iron infusion but I do not see evidence of that on the inpatient flowsheet. -Repeat labs PCP 7/5 showed Hgb 11.5, normal MCV and platelet, normal B12 and folate, and improving iron panel (TIBC remained elevated 511 and ferritin of 20) -He has stopped taking NSAIDs and continues PPI since hospitalization.  I encouraged him to separate oral iron and PPI by 2 hours and take iron with vitamin C to promote absorption. -Due to his previous gastrectomy he understands he may not absorb nutrients taken orally and may require long-term B12 injection and IV iron -For now he will continue oral iron and B12, we will obtain labs today for new baseline and set up B12 injection and IV iron if he is not maintaining levels on oral supplements -Continue lab every 2 months x 3, and follow-up in 6 months -Patient seen with Dr. Mosetta Putt   2. History of gastric cancer, pT1b N0 M0, grade I, stage IA -He developed abdominal pain and n/v in 10/2018, CT showed gastric wall thickening. I don't have records of diagnostic endoscopy or initial path -S/p subtotal  gastrectomy with B2 reconstruction and J-tube placement by Dr. 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 Polydipsia and Age-appropriate health maintenance -he reports excessive thirst, BG 79 - 130 during hospitalization.  -I recommend PCP f/up for hgb A1c and other age appropriate screenings  -I encouraged him to continue alcohol abstinence and continue to  try to quit smoking, he is motivated     PLAN: -Medical record reviewed -Continue oral B12 and iron once daily, separate from PPI by at least 2 hours and take iron with vit C -Lab today and q2 months x3; will set up B12 inj and IV iron if not able to maintain levels   The patient needs follow-up lab this visit patient declines flu vaccine patient does need a hepatitis C assay patient does complain of chronic neck pain   09/13/22       Review of Systems  Constitutional:  Negative for chills, diaphoresis, fever, malaise/fatigue and weight loss.  HENT:  Negative for congestion, hearing loss, nosebleeds, sore throat and tinnitus.   Eyes:  Negative for blurred vision, photophobia and redness.  Respiratory:  Negative for cough, hemoptysis, sputum production, shortness of breath, wheezing and stridor.   Cardiovascular:  Negative for chest pain, palpitations, orthopnea, claudication, leg swelling and PND.  Gastrointestinal:  Negative for abdominal pain, blood in stool, constipation, diarrhea, heartburn, nausea and vomiting.  Genitourinary:  Negative for dysuria, flank pain, frequency, hematuria and urgency.  Musculoskeletal:  Positive for myalgias and neck pain. Negative for back pain, falls and joint pain.  Skin:  Negative for itching and rash.  Neurological:  Negative for dizziness, tingling, tremors, sensory change, speech change, focal weakness, seizures, loss of consciousness, weakness and headaches.  Endo/Heme/Allergies:  Negative for environmental allergies and polydipsia. Does not bruise/bleed easily.  Psychiatric/Behavioral:  Negative for depression, memory loss, substance abuse and suicidal ideas. The patient is not nervous/anxious and does not have insomnia.       Objective:     There were no vitals taken for this visit.   Physical Exam Vitals reviewed.  Constitutional:      Appearance: Normal appearance. He is well-developed. He is not diaphoretic.  HENT:     Head:  Normocephalic and atraumatic.     Nose: No nasal deformity, septal deviation, mucosal edema or rhinorrhea.     Right Sinus: No maxillary sinus tenderness or frontal sinus tenderness.     Left Sinus: No maxillary sinus tenderness or frontal sinus tenderness.     Mouth/Throat:     Pharynx: No oropharyngeal exudate.  Eyes:     General: No scleral icterus.    Conjunctiva/sclera: Conjunctivae normal.     Pupils: Pupils are equal, round, and reactive to light.  Neck:     Thyroid: No thyromegaly.     Vascular: No carotid bruit or JVD.     Trachea: Trachea normal. No tracheal tenderness or tracheal deviation.  Cardiovascular:     Rate and Rhythm: Normal rate and regular rhythm.     Chest Wall: PMI is not displaced.     Pulses: Normal pulses. No decreased pulses.     Heart sounds: Normal heart sounds, S1 normal and S2 normal. Heart sounds not distant. No murmur heard.    No systolic murmur is present.     No diastolic murmur is present.  No friction rub. No gallop. No S3 or S4 sounds.  Pulmonary:     Effort: No tachypnea, accessory muscle usage or respiratory distress.     Breath sounds: No stridor. No decreased breath sounds, wheezing, rhonchi or rales.  Chest:     Chest wall: No tenderness.  Abdominal:     General: Bowel sounds are normal. There is no distension.     Palpations: Abdomen is soft. Abdomen is not rigid.     Tenderness: There is no abdominal tenderness. There is no guarding or rebound.  Musculoskeletal:        General: Tenderness present. Normal range of motion.     Cervical back: Normal range of motion and neck supple. No edema, erythema or rigidity. No muscular tenderness. Normal range of motion.     Comments: Trapezius muscles very tight and spastic  Lymphadenopathy:     Head:     Right side of head: No submental or submandibular adenopathy.     Left side of head: No submental or submandibular adenopathy.     Cervical: No cervical adenopathy.  Skin:    General:  Skin is warm and dry.     Coloration: Skin is not pale.     Findings: No rash.     Nails: There is no clubbing.  Neurological:     Mental Status: He is alert and oriented to person, place, and time.     Sensory: No sensory deficit.  Psychiatric:        Speech: Speech normal.        Behavior: Behavior normal.      No results found for any visits on 09/13/22.    The ASCVD Risk score (Arnett DK, et al., 2019) failed to calculate for the following reasons:   Cannot find a previous HDL lab   Cannot find a previous total cholesterol lab    Assessment & Plan:   Problem List Items Addressed This Visit   None 40 minutes spent multisystems assessed establishing care No follow-ups on file.    Shan Levans, MD

## 2022-09-13 ENCOUNTER — Ambulatory Visit: Payer: Medicaid Other | Attending: Critical Care Medicine | Admitting: Critical Care Medicine

## 2022-09-13 ENCOUNTER — Encounter: Payer: Self-pay | Admitting: Critical Care Medicine

## 2022-09-13 VITALS — BP 153/90 | HR 94 | Wt 176.0 lb

## 2022-09-13 DIAGNOSIS — I10 Essential (primary) hypertension: Secondary | ICD-10-CM | POA: Diagnosis not present

## 2022-09-13 DIAGNOSIS — E538 Deficiency of other specified B group vitamins: Secondary | ICD-10-CM

## 2022-09-13 DIAGNOSIS — D75839 Thrombocytosis, unspecified: Secondary | ICD-10-CM | POA: Diagnosis not present

## 2022-09-13 DIAGNOSIS — D649 Anemia, unspecified: Secondary | ICD-10-CM

## 2022-09-13 MED ORDER — METHOCARBAMOL 500 MG PO TABS: 500.0000 mg | ORAL_TABLET | Freq: Four times a day (QID) | ORAL | 2 refills | Status: DC | PRN

## 2022-09-13 MED ORDER — VITAMIN B-12 1000 MCG PO TABS: 1000.0000 ug | ORAL_TABLET | Freq: Every day | ORAL | 1 refills | Status: DC

## 2022-09-13 MED ORDER — PANTOPRAZOLE SODIUM 40 MG PO TBEC: 40.0000 mg | DELAYED_RELEASE_TABLET | Freq: Every day | ORAL | 1 refills | Status: DC

## 2022-09-13 MED ORDER — FERROUS SULFATE 325 (65 FE) MG PO TABS: 325.0000 mg | ORAL_TABLET | Freq: Two times a day (BID) | ORAL | 1 refills | Status: DC

## 2022-09-13 MED ORDER — AMLODIPINE BESYLATE 10 MG PO TABS: 10.0000 mg | ORAL_TABLET | Freq: Every day | ORAL | 1 refills | Status: DC

## 2022-09-13 NOTE — Patient Instructions (Signed)
Start amlodipine 1 daily for blood pressure  Refills on all other medications sent to your pharmacy  Blood work today to check your blood counts kidneys vitamin B12 and folate levels  Return to Dr. Delford Field 2 months and see our clinical pharmacist in 1 month for your blood pressure

## 2022-09-13 NOTE — Assessment & Plan Note (Signed)
New diagnosis of hypertension will begin amlodipine 10 mg daily and have patient seen in short-term follow-up

## 2022-09-14 LAB — VITAMIN B12: Vitamin B-12: 342 pg/mL (ref 232–1245)

## 2022-09-14 LAB — CBC WITH DIFFERENTIAL/PLATELET
Basophils Absolute: 0.1 10*3/uL (ref 0.0–0.2)
Basos: 1 %
EOS (ABSOLUTE): 0 10*3/uL (ref 0.0–0.4)
Eos: 0 %
Hematocrit: 49.3 % (ref 37.5–51.0)
Hemoglobin: 16 g/dL (ref 13.0–17.7)
Immature Grans (Abs): 0 10*3/uL (ref 0.0–0.1)
Immature Granulocytes: 0 %
Lymphocytes Absolute: 1.8 10*3/uL (ref 0.7–3.1)
Lymphs: 14 %
MCH: 30.2 pg (ref 26.6–33.0)
MCHC: 32.5 g/dL (ref 31.5–35.7)
MCV: 93 fL (ref 79–97)
Monocytes Absolute: 0.5 10*3/uL (ref 0.1–0.9)
Monocytes: 4 %
Neutrophils Absolute: 10.5 10*3/uL — ABNORMAL HIGH (ref 1.4–7.0)
Neutrophils: 81 %
Platelets: 360 10*3/uL (ref 150–450)
RBC: 5.29 x10E6/uL (ref 4.14–5.80)
RDW: 13 % (ref 11.6–15.4)
WBC: 12.9 10*3/uL — ABNORMAL HIGH (ref 3.4–10.8)

## 2022-09-14 LAB — BMP8+EGFR
BUN/Creatinine Ratio: 15 (ref 9–20)
BUN: 16 mg/dL (ref 6–24)
CO2: 23 mmol/L (ref 20–29)
Calcium: 9.9 mg/dL (ref 8.7–10.2)
Chloride: 103 mmol/L (ref 96–106)
Creatinine, Ser: 1.1 mg/dL (ref 0.76–1.27)
Glucose: 123 mg/dL — ABNORMAL HIGH (ref 70–99)
Potassium: 4.2 mmol/L (ref 3.5–5.2)
Sodium: 143 mmol/L (ref 134–144)
eGFR: 77 mL/min/{1.73_m2} (ref >59)

## 2022-09-14 LAB — FOLATE: Folate: 15 ng/mL (ref >3.0)

## 2022-09-14 NOTE — Progress Notes (Signed)
Let patient know all labs normal his vitamin levels normal. No change in medication

## 2022-09-16 ENCOUNTER — Telehealth: Payer: Self-pay

## 2022-09-16 NOTE — Telephone Encounter (Signed)
Pt was called and no vm was left due to mailbox not being set up.Information was sent to nurse pool.   ?

## 2022-09-16 NOTE — Telephone Encounter (Signed)
-----   Message from Storm Frisk, MD sent at 09/14/2022  6:10 AM EDT ----- Let patient know all labs normal his vitamin levels normal. No change in medication

## 2022-09-21 ENCOUNTER — Telehealth: Payer: Self-pay | Admitting: Nurse Practitioner

## 2022-09-21 NOTE — Telephone Encounter (Signed)
Patient is aware of upcoming appointment dates/times  

## 2022-10-14 ENCOUNTER — Encounter: Payer: Self-pay | Admitting: Pharmacist

## 2022-10-14 ENCOUNTER — Ambulatory Visit: Payer: Medicaid Other | Attending: Critical Care Medicine | Admitting: Pharmacist

## 2022-10-14 VITALS — BP 122/73 | HR 79

## 2022-10-14 DIAGNOSIS — I10 Essential (primary) hypertension: Secondary | ICD-10-CM

## 2022-10-14 NOTE — Progress Notes (Signed)
   S:     No chief complaint on file.  57 y.o. male who presents for hypertension evaluation, education, and management.  PMH is significant for HTN, hx of gastric cancer S/p subtotal gastrectomy and reconstruction, anemia, nicotine dependence. Patient was referred and last seen by Primary Care Provider, Dr. Delford Field, on 09/13/22. BP at that visit was 153/90 mmHg. Of note, he reported being down to 1 pack/3 days of cigarettes but was not taking BP medication at the time. Amlodipine was started.   Today, patient arrives in good spirits and presents without assistance. Denies dizziness, headache, blurred vision, swelling.   Family/Social history:  Fhx: no pertinent positives Tobacco: current 1 pack every 3 day smoker  Alcohol: none reported  Medication adherence reported. Patient has taken BP medications today.   Current antihypertensives include: amlodipine 10 mg daily  Reported home BP readings: none   Patient reported dietary habits:  -Tries to limit sodium but not fully adherent to a low-sodium diet -Denies excessive intake of caffeine   Patient-reported exercise habits: always active, walking or working. No formal exercise regimen.   O:  Vitals:   10/14/22 1452  BP: 122/73  Pulse: 79    Last 3 Office BP readings: BP Readings from Last 3 Encounters:  09/13/22 (!) 153/90  05/17/22 125/80  12/30/21 (!) 160/90    BMET    Component Value Date/Time   NA 143 09/13/2022 1447   K 4.2 09/13/2022 1447   CL 103 09/13/2022 1447   CO2 23 09/13/2022 1447   GLUCOSE 123 (H) 09/13/2022 1447   GLUCOSE 130 (H) 03/28/2022 1427   BUN 16 09/13/2022 1447   CREATININE 1.10 09/13/2022 1447   CREATININE 0.91 03/28/2022 1427   CALCIUM 9.9 09/13/2022 1447   GFRNONAA >60 03/28/2022 1427   GFRAA >60 03/24/2019 0332    Renal function: CrCl cannot be calculated (Patient's most recent lab result is older than the maximum 21 days allowed.).  Clinical ASCVD: No  The ASCVD Risk score (Arnett  DK, et al., 2019) failed to calculate for the following reasons:   Cannot find a previous HDL lab   Cannot find a previous total cholesterol lab  Patient is participating in a Managed Medicaid Plan:  no   A/P: Hypertension diagnosed currently at goal on current medications. BP goal < 130/80 mmHg. Medication adherence appears to be appropriate.  -Continued amlodipine 10 mg daily.   -Patient educated on purpose, proper use, and potential adverse effects of amlodipine.  -F/u labs ordered - none -Counseled on lifestyle modifications for blood pressure control including reduced dietary sodium, increased exercise, adequate sleep. -Encouraged patient to check BP at home and bring log of readings to next visit. Counseled on proper use of home BP cuff.   Results reviewed and written information provided.    Written patient instructions provided. Patient verbalized understanding of treatment plan.  Total time in face to face counseling 20 minutes.    Follow-up:  Pharmacist prn. PCP clinic visit 11/15/2022.   Butch Penny, PharmD, P[place]y [place], CPP Clinical Pharmacist [facility] [phone]

## 2022-11-14 NOTE — Progress Notes (Unsigned)
Established Patient Office Visit  Subjective   Patient ID: Evan Brown, male    DOB: 1965/11/23  Age: 57 y.o. MRN: [id number]  No chief complaint on file.    05/17/22 This is a new patient to the practice he had been seen twice once in July and again in September by PA Mcclung.  Below is documentation from that visit and the hematology visits in August. Patient has a history of gastritis status post gastrectomy for stomach cancer severe iron deficiency anemia and blood loss anemia.  Hospitalization in June showed severe microcytic anemia hemoglobin of 3.8 he got 5 units of blood with that admission.  He is not drinking alcohol but he is still heavily smoking half a pack a day.  He does have Medicaid.  On arrival blood pressure is good 125/80.  He is here accompanied by his spouse. Below are prior documentations New patient .  Seen 12/2021 by Sharon Seller:  Evan Brown is a 57 y.o. male here today for a follow up visit.  He is doing well.  He c/o polydipsia and wants to be checked for diabetes.  He denies polyuria.  Blood sugar in June =103.  He established with heme/ onc in august.  Still needs GI appt.  He says he and his sister can never get anyone on the phone there.  He denies alcohol use.  He says his energy levels are good.  Appetite is good.  No melena/hematochezia.  Has iron infusion tomorrow.  H/o GI cancer and s/p gastrectomy 2020   Seen by heme/onc 11/25/2021: ASSESSMENT & PLAN:57 year old male  1.Iron and B12 deficiency anemia,secondary to gastrectomy and NSAID induced gastritis -we reviewed his medical record including lab and endoscopy in detail with the patient and hisfiance. -He was taking 2 NSAIDs per day x 1 year for pain prophylaxis (he was working laborious jobs at the time),presented to ED with symptomatic anemia -Workup 09/22/2021 showed microcytic anemia with Hgb 3.8, thrombocytosis, ferritin 2, iron 11, TIBC 624, and transferrin saturation 2%, folate low 146.  Immature retic % was elevated but absolute reticcount normal.FOBT negative.PT-INR and aPTT were normal.No evidence of CKD -He was admitted 6/3 -09/22/21, endoscopy by Dr. Ewing Schlein showed chronic gastritis;no bleeding source on colonoscopy. He received 5 units blood transfusion, 1 B12 injection,and reportedly receivedanIV iron infusion but I do not see evidence of thaton the inpatient flowsheet. -Repeat labs PCP 7/5 showed Hgb 11.5, normal MCV and platelet,normal B12 and folate, and improving iron panel(TIBC remained elevated 511 and ferritin of 20) -He has stopped taking NSAIDsand continues PPIsince hospitalization. I encouraged him to separate oral iron and PPI by 2 hours and take iron with vitamin C to promote absorption. -Due to his previous gastrectomy he understands he may not absorb nutrients taken orallyandmay require long-term B12 injection and IV iron -For now he will continue oral iron and B12, we will obtain labs today for new baselineandset up B12 injection and IV iron if he is not maintaining levels on oral supplements -Continue lab every 2 months x 3, and follow-up in 6 months -Patient seen with Dr. Mosetta Putt  2.History of gastric cancer,pT1b N0 M0, grade I,stage IA -He developed abdominal pain andn/vin 10/2018, CT showed gastric wall thickening. I don't have records of diagnostic endoscopy or initial path -S/p subtotal gastrectomy with B2 reconstruction and J-tube placement by Dr. 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 Polydipsia andAge-appropriate health maintenance -he reports excessive thirst, BG 79 - 130 during hospitalization.  -I recommend PCP f/up for hgb A1c and other age appropriate screenings  -I encouraged him to continue alcohol abstinence and continue to  try to quit smoking, he is motivated   PLAN: -Medical record reviewed -Continue oral B12 and iron once daily, separate from PPI by at least 2 hours and take iron with vit C -Lab today and q2 months x3; will set up B12 inj and IV iron if not able to maintain levels   The patient needs follow-up lab this visit patient declines flu vaccine patient does need a hepatitis C assay patient does complain of chronic neck pain   09/13/22 The patient is seen in return follow-up and has no new complaints.  He is down to 1 pack every 3 days of cigarettes.  On arrival blood pressure is elevated.  He has not been on blood pressure medicines before.  He needs refills on multiple medications.      Review of Systems  Constitutional:  Negative for chills, diaphoresis, fever, malaise/fatigue and weight loss.  HENT:  Negative for congestion, hearing loss, nosebleeds, sore throat and tinnitus.   Eyes:  Negative for blurred vision, photophobia and redness.  Respiratory:  Negative for cough, hemoptysis, sputum production, shortness of breath, wheezing and stridor.   Cardiovascular:  Negative for chest pain, palpitations, orthopnea, claudication, leg swelling and PND.  Gastrointestinal:  Negative for abdominal pain, blood in stool, constipation, diarrhea, heartburn, nausea and vomiting.  Genitourinary:  Negative for dysuria, flank pain, frequency, hematuria and urgency.  Musculoskeletal:  Positive for myalgias and neck pain. Negative for back pain, falls and joint pain.  Skin:  Negative for itching and rash.  Neurological:  Negative for dizziness, tingling, tremors, sensory change, speech change, focal weakness, seizures, loss of consciousness, weakness and headaches.  Endo/Heme/Allergies:  Negative for environmental allergies and polydipsia. Does not bruise/bleed easily.  Psychiatric/Behavioral:  Negative for depression, memory loss, substance abuse and suicidal ideas. The patient is not nervous/anxious and  does not have insomnia.       Objective:     There were no vitals taken for this visit.   Physical Exam Vitals reviewed.  Constitutional:      Appearance: Normal appearance. He is well-developed. He is not diaphoretic.  HENT:     Head: Normocephalic and atraumatic.     Nose: No nasal deformity, septal deviation, mucosal edema or rhinorrhea.     Right Sinus: No maxillary sinus tenderness or frontal sinus tenderness.     Left Sinus: No maxillary sinus tenderness or frontal sinus tenderness.     Mouth/Throat:     Pharynx: No oropharyngeal exudate.  Eyes:     General: No scleral icterus.    Conjunctiva/sclera: Conjunctivae normal.     Pupils: Pupils are equal, round, and reactive to light.  Neck:     Thyroid: No thyromegaly.     Vascular: No carotid bruit or JVD.     Trachea: Trachea normal. No tracheal tenderness or tracheal deviation.  Cardiovascular:     Rate and Rhythm: Normal rate and regular rhythm.     Chest Wall: PMI is not displaced.     Pulses: Normal pulses. No decreased pulses.     Heart sounds: Normal heart sounds, S1 normal and S2 normal. Heart sounds not distant. No murmur heard.    No systolic murmur is present.  No diastolic murmur is present.     No friction rub. No gallop. No S3 or S4 sounds.  Pulmonary:     Effort: No tachypnea, accessory muscle usage or respiratory distress.     Breath sounds: No stridor. No decreased breath sounds, wheezing, rhonchi or rales.  Chest:     Chest wall: No tenderness.  Abdominal:     General: Bowel sounds are normal. There is no distension.     Palpations: Abdomen is soft. Abdomen is not rigid.     Tenderness: There is no abdominal tenderness. There is no guarding or rebound.  Musculoskeletal:        General: Tenderness present. Normal range of motion.     Cervical back: Normal range of motion and neck supple. No edema, erythema or rigidity. No muscular tenderness. Normal range of motion.     Comments: Trapezius  muscles very tight and spastic  Lymphadenopathy:     Head:     Right side of head: No submental or submandibular adenopathy.     Left side of head: No submental or submandibular adenopathy.     Cervical: No cervical adenopathy.  Skin:    General: Skin is warm and dry.     Coloration: Skin is not pale.     Findings: No rash.     Nails: There is no clubbing.  Neurological:     Mental Status: He is alert and oriented to person, place, and time.     Sensory: No sensory deficit.  Psychiatric:        Speech: Speech normal.        Behavior: Behavior normal.     No results found for any visits on 11/15/22.    The ASCVD Risk score (Arnett DK, et al., 2019) failed to calculate for the following reasons:   Cannot find a previous HDL lab   Cannot find a previous total cholesterol lab    Assessment & Plan:   Problem List Items Addressed This Visit   None   No follow-ups on file.    Shan Levans, MD

## 2022-11-15 ENCOUNTER — Encounter: Payer: Self-pay | Admitting: Critical Care Medicine

## 2022-11-15 ENCOUNTER — Ambulatory Visit: Payer: Medicaid Other | Attending: Critical Care Medicine | Admitting: Critical Care Medicine

## 2022-11-15 VITALS — BP 126/81 | HR 88 | Wt 171.0 lb

## 2022-11-15 DIAGNOSIS — F17210 Nicotine dependence, cigarettes, uncomplicated: Secondary | ICD-10-CM | POA: Insufficient documentation

## 2022-11-15 DIAGNOSIS — D509 Iron deficiency anemia, unspecified: Secondary | ICD-10-CM | POA: Diagnosis not present

## 2022-11-15 DIAGNOSIS — I10 Essential (primary) hypertension: Secondary | ICD-10-CM | POA: Diagnosis present

## 2022-11-15 DIAGNOSIS — K2950 Unspecified chronic gastritis without bleeding: Secondary | ICD-10-CM | POA: Diagnosis not present

## 2022-11-15 DIAGNOSIS — Z85028 Personal history of other malignant neoplasm of stomach: Secondary | ICD-10-CM | POA: Diagnosis not present

## 2022-11-15 DIAGNOSIS — D649 Anemia, unspecified: Secondary | ICD-10-CM | POA: Diagnosis not present

## 2022-11-15 DIAGNOSIS — Z8719 Personal history of other diseases of the digestive system: Secondary | ICD-10-CM | POA: Insufficient documentation

## 2022-11-15 DIAGNOSIS — Z713 Dietary counseling and surveillance: Secondary | ICD-10-CM | POA: Diagnosis not present

## 2022-11-15 DIAGNOSIS — K2970 Gastritis, unspecified, without bleeding: Secondary | ICD-10-CM | POA: Insufficient documentation

## 2022-11-15 MED ORDER — METHOCARBAMOL 500 MG PO TABS: 500.0000 mg | ORAL_TABLET | Freq: Four times a day (QID) | ORAL | 2 refills | Status: DC | PRN

## 2022-11-15 MED ORDER — PANTOPRAZOLE SODIUM 40 MG PO TBEC: 40.0000 mg | DELAYED_RELEASE_TABLET | Freq: Every day | ORAL | 1 refills | Status: DC

## 2022-11-15 MED ORDER — AMLODIPINE BESYLATE 10 MG PO TABS: 10.0000 mg | ORAL_TABLET | Freq: Every day | ORAL | 1 refills | Status: DC

## 2022-11-15 NOTE — Assessment & Plan Note (Signed)
    Current smoking consumption amount: 1/2 pack a day  Dicsussion on advise to quit smoking and smoking impacts: Cancer impacts  Patient's willingness to quit: Wants to quit  Methods to quit smoking discussed: Nicotine replacement  Medication management of smoking session drugs discussed: Nicotine lozenge  Resources provided:  AVS   Setting quit date not established  Follow-up arranged 3 months   Time spent counseling the patient:   5 minutes

## 2022-11-15 NOTE — Assessment & Plan Note (Signed)
Primary hypertension well-controlled continue with amlodipine 10 mg daily labs will be obtained

## 2022-11-15 NOTE — Assessment & Plan Note (Signed)
Continue with proton pump inhibitor 

## 2022-11-15 NOTE — Patient Instructions (Signed)
No change in medications Refills sent to [facility]

## 2022-11-27 NOTE — Progress Notes (Unsigned)
Patient Care Team: Storm Frisk, MD as PCP - General (Pulmonary Disease)   CHIEF COMPLAINT: Follow up anemia and h/o gastric cancer   Oncology History  Gastric cancer pT1pN0 (0/12LN) s/p subtotal gastrectomy/B2 reconstruction 03/19/2019  03/19/2019 Initial Diagnosis   Gastric cancer pT1pN0 (0/12LN) s/p subtotal gastrectomy/B2 reconstruction 03/19/2019   03/19/2019 Cancer Staging   Staging form: Stomach, AJCC 8th Edition - Pathologic stage from 03/19/2019: Stage IA (pT1b, pN0, cM0) - Signed by Pollyann Samples, NP on 11/25/2021 Total positive nodes: 0 Histologic grade (G): G1 Histologic grading system: 3 grade system      CURRENT THERAPY: Oral B12 and iron once daily, and gastric cancer surveillance   INTERVAL HISTORY Evan Brown returns for follow up as scheduled, last seen by me 11/25/21. Labs 09/13/22 showed normal CBC and B12, 342  ROS   Past Medical History:  Diagnosis Date   Anemia    Gastric cancer pT1pN0 (0/12LN) s/p subtotal gastrectomy/B2 reconstruction 03/19/2019 03/19/2019     Past Surgical History:  Procedure Laterality Date   COLONOSCOPY N/A 09/22/2021   Procedure: COLONOSCOPY;  Surgeon: Vida Rigger, MD;  Location: [facility] ENDOSCOPY;  Service: Gastroenterology;  Laterality: N/A;   ESOPHAGOGASTRODUODENOSCOPY (EGD) WITH PROPOFOL N/A 09/21/2021   Procedure: ESOPHAGOGASTRODUODENOSCOPY (EGD) WITH PROPOFOL;  Surgeon: Vida Rigger, MD;  Location: [facility] ENDOSCOPY;  Service: Gastroenterology;  Laterality: N/A;   FLEXIBLE SIGMOIDOSCOPY N/A 09/21/2021   Procedure: FLEXIBLE SIGMOIDOSCOPY;  Surgeon: Vida Rigger, MD;  Location: [facility] ENDOSCOPY;  Service: Gastroenterology;  Laterality: N/A;   GASTROSTOMY N/A 03/19/2019   Procedure: INSERTION OF JEJUNOSTOMY TUBE;  Surgeon: Almond Lint, MD;  Location: [facility] OR;  Service: General;  Laterality: N/A;   LAPAROSCOPY N/A 03/19/2019   Procedure: LAPAROSCOPY DIAGNOSTIC;  Surgeon: Almond Lint, MD;  Location: [facility] OR;  Service: General;  Laterality: N/A;    PARTIAL GASTRECTOMY N/A 03/19/2019   Procedure: PARTIAL GASTRECTOMY;  Surgeon: Almond Lint, MD;  Location: [facility] OR;  Service: General;  Laterality: N/A;   POLYPECTOMY  09/22/2021   Procedure: POLYPECTOMY;  Surgeon: Vida Rigger, MD;  Location: [facility] ENDOSCOPY;  Service: Gastroenterology;;     Outpatient Encounter Medications as of 11/29/2022  Medication Sig   acetaminophen (TYLENOL) 500 MG tablet Take 500 mg by mouth every 6 (six) hours as needed for moderate pain.   amLODipine (NORVASC) 10 MG tablet Take 1 tablet (10 mg total) by mouth daily.   cyanocobalamin (VITAMIN B12) 1000 MCG tablet Take 1 tablet (1,000 mcg total) by mouth daily.   ferrous sulfate 325 (65 FE) MG tablet Take 1 tablet (325 mg total) by mouth 2 (two) times daily.   methocarbamol (ROBAXIN) 500 MG tablet Take 1 tablet (500 mg total) by mouth every 6 (six) hours as needed for muscle spasms.   nicotine polacrilex (NICORETTE MINI) 4 MG lozenge Use three times daily to quit smoking   pantoprazole (PROTONIX) 40 MG tablet Take 1 tablet (40 mg total) by mouth daily.   No facility-administered encounter medications on file as of 11/29/2022.     There were no vitals filed for this visit. There is no height or weight on file to calculate BMI.   PHYSICAL EXAM GENERAL:alert, no distress and comfortable SKIN: no rash  EYES: sclera clear NECK: without mass LYMPH:  no palpable cervical or supraclavicular lymphadenopathy  LUNGS: clear with normal breathing effort HEART: regular rate & rhythm, no lower extremity edema ABDOMEN: abdomen soft, non-tender and normal bowel sounds NEURO: alert & oriented x 3 with fluent speech, no focal  motor/sensory deficits Breast exam:  PAC without erythema    CBC    Component Value Date/Time   WBC 12.9 (H) 09/13/2022 1447   WBC 9.9 03/28/2022 1427   WBC 9.7 09/22/2021 0409   RBC 5.29 09/13/2022 1447   RBC 5.22 03/28/2022 1427   RBC 5.20 03/28/2022 1427   HGB 16.0 09/13/2022 1447   HCT 49.3  09/13/2022 1447   PLT 360 09/13/2022 1447   MCV 93 09/13/2022 1447   MCH 30.2 09/13/2022 1447   MCH 30.7 03/28/2022 1427   MCHC 32.5 09/13/2022 1447   MCHC 32.9 03/28/2022 1427   RDW 13.0 09/13/2022 1447   LYMPHSABS 1.8 09/13/2022 1447   MONOABS 0.7 03/28/2022 1427   EOSABS 0.0 09/13/2022 1447   BASOSABS 0.1 09/13/2022 1447     CMP     Component Value Date/Time   NA 143 09/13/2022 1447   K 4.2 09/13/2022 1447   CL 103 09/13/2022 1447   CO2 23 09/13/2022 1447   GLUCOSE 123 (H) 09/13/2022 1447   GLUCOSE 130 (H) 03/28/2022 1427   BUN 16 09/13/2022 1447   CREATININE 1.10 09/13/2022 1447   CREATININE 0.91 03/28/2022 1427   CALCIUM 9.9 09/13/2022 1447   PROT 6.4 05/17/2022 1028   ALBUMIN 4.2 05/17/2022 1028   AST 13 05/17/2022 1028   AST 12 (L) 03/28/2022 1427   ALT 15 05/17/2022 1028   ALT 11 03/28/2022 1427   ALKPHOS 148 (H) 05/17/2022 1028   BILITOT 0.3 05/17/2022 1028   BILITOT 0.3 03/28/2022 1427   GFRNONAA >60 03/28/2022 1427   GFRAA >60 03/24/2019 0332     ASSESSMENT & PLAN:  PLAN:  No orders of the defined types were placed in this encounter.     All questions were answered. The patient knows to call the clinic with any problems, questions or concerns. No barriers to learning were detected. I spent *** counseling the patient face to face. The total time spent in the appointment was *** and more than 50% was on counseling, review of test results, and coordination of care.   Santiago Glad, NP-C @DATE @

## 2022-11-28 ENCOUNTER — Other Ambulatory Visit: Payer: Self-pay | Admitting: Nurse Practitioner

## 2022-11-28 DIAGNOSIS — E538 Deficiency of other specified B group vitamins: Secondary | ICD-10-CM

## 2022-11-29 ENCOUNTER — Inpatient Hospital Stay: Payer: Medicaid Other | Attending: Nurse Practitioner

## 2022-11-29 ENCOUNTER — Inpatient Hospital Stay: Payer: Medicaid Other | Admitting: Nurse Practitioner

## 2022-11-29 ENCOUNTER — Encounter: Payer: Self-pay | Admitting: Nurse Practitioner

## 2022-11-29 ENCOUNTER — Other Ambulatory Visit: Payer: Self-pay

## 2022-11-29 DIAGNOSIS — D509 Iron deficiency anemia, unspecified: Secondary | ICD-10-CM | POA: Insufficient documentation

## 2022-11-29 DIAGNOSIS — Z85028 Personal history of other malignant neoplasm of stomach: Secondary | ICD-10-CM | POA: Insufficient documentation

## 2022-11-29 DIAGNOSIS — D649 Anemia, unspecified: Secondary | ICD-10-CM

## 2022-11-29 DIAGNOSIS — D75839 Thrombocytosis, unspecified: Secondary | ICD-10-CM

## 2022-11-29 DIAGNOSIS — E538 Deficiency of other specified B group vitamins: Secondary | ICD-10-CM

## 2022-11-29 DIAGNOSIS — D72829 Elevated white blood cell count, unspecified: Secondary | ICD-10-CM | POA: Diagnosis not present

## 2022-11-29 DIAGNOSIS — Z08 Encounter for follow-up examination after completed treatment for malignant neoplasm: Secondary | ICD-10-CM | POA: Insufficient documentation

## 2022-11-29 LAB — CMP (CANCER CENTER ONLY)
ALT: 13 U/L (ref 0–44)
AST: 14 U/L — ABNORMAL LOW (ref 15–41)
Albumin: 4 g/dL (ref 3.5–5.0)
Alkaline Phosphatase: 101 U/L (ref 38–126)
Anion gap: 4 — ABNORMAL LOW (ref 5–15)
BUN: 21 mg/dL — ABNORMAL HIGH (ref 6–20)
CO2: 27 mmol/L (ref 22–32)
Calcium: 8.8 mg/dL — ABNORMAL LOW (ref 8.9–10.3)
Chloride: 109 mmol/L (ref 98–111)
Creatinine: 0.87 mg/dL (ref 0.61–1.24)
GFR, Estimated: >60 mL/min (ref >60)
Glucose, Bld: 102 mg/dL — ABNORMAL HIGH (ref 70–99)
Potassium: 3.9 mmol/L (ref 3.5–5.1)
Sodium: 140 mmol/L (ref 135–145)
Total Bilirubin: 0.3 mg/dL (ref 0.3–1.2)
Total Protein: 6.9 g/dL (ref 6.5–8.1)

## 2022-11-29 LAB — CBC WITH DIFFERENTIAL (CANCER CENTER ONLY)
Abs Immature Granulocytes: 0.03 10*3/uL (ref 0.00–0.07)
Basophils Absolute: 0.1 10*3/uL (ref 0.0–0.1)
Basophils Relative: 1 %
Eosinophils Absolute: 0.1 10*3/uL (ref 0.0–0.5)
Eosinophils Relative: 1 %
HCT: 41.6 % (ref 39.0–52.0)
Hemoglobin: 14.4 g/dL (ref 13.0–17.0)
Immature Granulocytes: 0 %
Lymphocytes Relative: 20 %
Lymphs Abs: 2.3 10*3/uL (ref 0.7–4.0)
MCH: 31.1 pg (ref 26.0–34.0)
MCHC: 34.6 g/dL (ref 30.0–36.0)
MCV: 89.8 fL (ref 80.0–100.0)
Monocytes Absolute: 0.8 10*3/uL (ref 0.1–1.0)
Monocytes Relative: 7 %
Neutro Abs: 8.3 10*3/uL — ABNORMAL HIGH (ref 1.7–7.7)
Neutrophils Relative %: 71 %
Platelet Count: 353 10*3/uL (ref 150–400)
RBC: 4.63 MIL/uL (ref 4.22–5.81)
RDW: 14 % (ref 11.5–15.5)
WBC Count: 11.7 10*3/uL — ABNORMAL HIGH (ref 4.0–10.5)
nRBC: 0 % (ref 0.0–0.2)

## 2022-11-29 LAB — IRON AND IRON BINDING CAPACITY (CC-WL,HP ONLY)
Iron: 65 ug/dL (ref 45–182)
Saturation Ratios: 14 % — ABNORMAL LOW (ref 17.9–39.5)
TIBC: 482 ug/dL — ABNORMAL HIGH (ref 250–450)
UIBC: 417 ug/dL — ABNORMAL HIGH (ref 117–376)

## 2022-11-29 LAB — VITAMIN B12: Vitamin B-12: 192 pg/mL (ref 180–914)

## 2022-11-29 LAB — FERRITIN: Ferritin: 19 ng/mL — ABNORMAL LOW (ref 24–336)

## 2022-11-29 MED ORDER — FERROUS SULFATE 325 (65 FE) MG PO TABS: 325.0000 mg | ORAL_TABLET | Freq: Every day | ORAL | 3 refills | Status: AC

## 2022-11-29 MED ORDER — VITAMIN B-12 1000 MCG PO TABS
1000.0000 ug | ORAL_TABLET | Freq: Every day | ORAL | 3 refills | Status: DC
Start: 2022-11-29 — End: 2023-09-12

## 2022-11-30 ENCOUNTER — Telehealth: Payer: Self-pay | Admitting: Hematology

## 2023-05-22 ENCOUNTER — Ambulatory Visit: Payer: Medicaid Other | Attending: Family Medicine | Admitting: Family Medicine

## 2023-05-22 ENCOUNTER — Encounter: Payer: Self-pay | Admitting: Family Medicine

## 2023-05-22 VITALS — BP 122/61 | HR 74 | Ht 75.0 in | Wt 190.4 lb

## 2023-05-22 DIAGNOSIS — D649 Anemia, unspecified: Secondary | ICD-10-CM

## 2023-05-22 DIAGNOSIS — E538 Deficiency of other specified B group vitamins: Secondary | ICD-10-CM

## 2023-05-22 DIAGNOSIS — I10 Essential (primary) hypertension: Secondary | ICD-10-CM | POA: Diagnosis not present

## 2023-05-22 DIAGNOSIS — K219 Gastro-esophageal reflux disease without esophagitis: Secondary | ICD-10-CM

## 2023-05-22 DIAGNOSIS — M62838 Other muscle spasm: Secondary | ICD-10-CM

## 2023-05-22 MED ORDER — AMLODIPINE BESYLATE 10 MG PO TABS
10.0000 mg | ORAL_TABLET | Freq: Every day | ORAL | 1 refills | Status: DC
Start: 2023-05-22 — End: 2024-03-03

## 2023-05-22 MED ORDER — METHOCARBAMOL 500 MG PO TABS
500.0000 mg | ORAL_TABLET | Freq: Three times a day (TID) | ORAL | 2 refills | Status: DC | PRN
Start: 2023-05-22 — End: 2023-10-18

## 2023-05-22 MED ORDER — PANTOPRAZOLE SODIUM 40 MG PO TBEC
40.0000 mg | DELAYED_RELEASE_TABLET | Freq: Every day | ORAL | 1 refills | Status: DC
Start: 2023-05-22 — End: 2023-11-27

## 2023-05-22 NOTE — Patient Instructions (Signed)
VISIT SUMMARY:  You came in today for a routine follow-up. Your blood pressure is well controlled with your current medication, and your acid reflux is being managed with Protonix. Your iron and B12 levels are stable with your current supplements. You also mentioned occasional left arm discomfort, for which you use a muscle relaxant as needed. We also discussed the need to check your cholesterol levels.  YOUR PLAN:  -HYPERTENSION: Hypertension means high blood pressure. Your blood pressure is well controlled with Amlodipine 10mg  daily. Please continue taking this medication as prescribed.  -GASTROESOPHAGEAL REFLUX DISEASE (GERD): GERD is a condition where stomach acid frequently flows back into the tube connecting your mouth and stomach. You are currently managing this with Protonix. Please continue taking Protonix as prescribed.  -IRON AND VITAMIN B12 DEFICIENCY: Iron and Vitamin B12 deficiency means your body does not have enough of these essential nutrients. Your last labs showed normal levels, but we will check your iron and B12 levels again to ensure your supplements are still working effectively.  -MUSCULOSKELETAL PAIN: Musculoskeletal pain refers to pain in the muscles, bones, or joints. You have occasional left arm discomfort and use a muscle relaxant as needed. Please continue using the muscle relaxant when necessary.  -HYPERLIPIDEMIA: Hyperlipidemia means having high levels of fats (lipids) in your blood. We need to check your cholesterol levels, so please schedule a fasting lipid panel for tomorrow.  INSTRUCTIONS:  Please schedule a fasting lipid panel for tomorrow. Follow-up in 6 months.

## 2023-05-22 NOTE — Progress Notes (Signed)
Subjective:  Patient ID: Evan Brown, male    DOB: [birth year]  Age: 58 y.o. MRN: [id number]  CC: Hypertension   HPI Evan Brown is a 58 y.o. year old male with a history of gastric cancer (status post partial gastrectomy and J-tube which was subsequently removed),  B12 def, iron def anemia, GERD, hypertension nicotine dependence  Interval History: Discussed the use of AI scribe software for clinical note transcription with the patient, who gave verbal consent to proceed.  He presents for a routine follow-up. He reports compliance with his low-dose antihypertensive medication, amlodipine, and his blood pressure is well controlled. He also has a history of acid reflux, for which he takes Protonix.  The patient has a history of B12 deficiency and iron deficiency, for which he takes B12 and iron supplements. He reports no adverse effects from the iron supplement, such as constipation. He also mentions a past iron transfusion, but the exact timing is unclear.  He also takes a muscle relaxant as needed for left arm discomfort, which he describes as a limited range of motion. He does not take the muscle relaxant regularly, but only when needed.        Past Medical History:  Diagnosis Date   Anemia    Gastric cancer pT1pN0 (0/12LN) s/p subtotal gastrectomy/B2 reconstruction 03/19/2019 03/19/2019    Past Surgical History:  Procedure Laterality Date   COLONOSCOPY N/A 09/22/2021   Procedure: COLONOSCOPY;  Surgeon: Vida Rigger, MD;  Location: [facility] ENDOSCOPY;  Service: Gastroenterology;  Laterality: N/A;   ESOPHAGOGASTRODUODENOSCOPY (EGD) WITH PROPOFOL N/A 09/21/2021   Procedure: ESOPHAGOGASTRODUODENOSCOPY (EGD) WITH PROPOFOL;  Surgeon: Vida Rigger, MD;  Location: [facility] ENDOSCOPY;  Service: Gastroenterology;  Laterality: N/A;   FLEXIBLE SIGMOIDOSCOPY N/A 09/21/2021   Procedure: FLEXIBLE SIGMOIDOSCOPY;  Surgeon: Vida Rigger, MD;  Location: [facility] ENDOSCOPY;  Service: Gastroenterology;  Laterality: N/A;    GASTROSTOMY N/A 03/19/2019   Procedure: INSERTION OF JEJUNOSTOMY TUBE;  Surgeon: Almond Lint, MD;  Location: [facility] OR;  Service: General;  Laterality: N/A;   LAPAROSCOPY N/A 03/19/2019   Procedure: LAPAROSCOPY DIAGNOSTIC;  Surgeon: Almond Lint, MD;  Location: [facility] OR;  Service: General;  Laterality: N/A;   PARTIAL GASTRECTOMY N/A 03/19/2019   Procedure: PARTIAL GASTRECTOMY;  Surgeon: Almond Lint, MD;  Location: [facility] OR;  Service: General;  Laterality: N/A;   POLYPECTOMY  09/22/2021   Procedure: POLYPECTOMY;  Surgeon: Vida Rigger, MD;  Location: [facility] ENDOSCOPY;  Service: Gastroenterology;;    Family History  Problem Relation Age of Onset   Cancer Mother        ? brain tumors   Cancer Brother        thyroid   Heart disease Neg Hx     Social History   Socioeconomic History   Marital status: Single    Spouse name: Not on file   Number of children: 4   Years of education: Not on file   Highest education level: Not on file  Occupational History   Not on file  Tobacco Use   Smoking status: Every Day    Current packs/day: 0.50    Average packs/day: 0.5 packs/day for 20.0 years (10.0 ttl pk-yrs)    Types: Cigarettes   Smokeless tobacco: Never  Vaping Use   Vaping status: Never Used  Substance and Sexual Activity   Alcohol use: Not Currently    Comment: quit 2020 with stomach cancer dx   Drug use: Yes    Types: Marijuana    Comment: every other day  Sexual activity: Not on file  Other Topics Concern   Not on file  Social History Narrative   Not on file   Social Drivers of Health   Financial Resource Strain: Medium Risk (05/22/2023)   Overall Financial Resource Strain (CARDIA)    Difficulty of Paying Living Expenses: Somewhat hard  Food Insecurity: Food Insecurity Present (05/22/2023)   Hunger Vital Sign    Worried About Running Out of Food in the Last Year: Never true    Ran Out of Food in the Last Year: Sometimes true  Transportation Needs: No Transportation Needs (05/22/2023)    PRAPARE - Administrator, Civil Service (Medical): No    Lack of Transportation (Non-Medical): No  Physical Activity: Inactive (05/22/2023)   Exercise Vital Sign    Days of Exercise per Week: 0 days    Minutes of Exercise per Session: 0 min  Stress: No Stress Concern Present (05/22/2023)   [employer] of Occupational Health - Occupational Stress Questionnaire    Feeling of Stress : Not at all  Social Connections: Socially Isolated (05/22/2023)   Social Connection and Isolation Panel [NHANES]    Frequency of Communication with Friends and Family: Once a week    Frequency of Social Gatherings with Friends and Family: Once a week    Attends Religious Services: 1 to 4 times per year    Active Member of [employer] or Organizations: No    Attends Banker Meetings: Never    Marital Status: Never married    No Known Allergies  Outpatient Medications Prior to Visit  Medication Sig Dispense Refill   acetaminophen (TYLENOL) 500 MG tablet Take 500 mg by mouth every 6 (six) hours as needed for moderate pain.     cyanocobalamin (VITAMIN B12) 1000 MCG tablet Take 1 tablet (1,000 mcg total) by mouth daily. 90 tablet 3   ferrous sulfate 325 (65 FE) MG tablet Take 1 tablet (325 mg total) by mouth daily with breakfast. 90 tablet 3   amLODipine (NORVASC) 10 MG tablet Take 1 tablet (10 mg total) by mouth daily. 90 tablet 1   methocarbamol (ROBAXIN) 500 MG tablet Take 1 tablet (500 mg total) by mouth every 6 (six) hours as needed for muscle spasms. 90 tablet 2   pantoprazole (PROTONIX) 40 MG tablet Take 1 tablet (40 mg total) by mouth daily. 90 tablet 1   nicotine polacrilex (NICORETTE MINI) 4 MG lozenge Use three times daily to quit smoking (Patient not taking: Reported on 05/22/2023) 100 tablet 4   No facility-administered medications prior to visit.     ROS Review of Systems  Constitutional:  Negative for activity change and appetite change.  HENT:  Negative for sinus pressure  and sore throat.   Respiratory:  Negative for chest tightness, shortness of breath and wheezing.   Cardiovascular:  Negative for chest pain and palpitations.  Gastrointestinal:  Negative for abdominal distention, abdominal pain and constipation.  Genitourinary: Negative.   Musculoskeletal: Negative.   Psychiatric/Behavioral:  Negative for behavioral problems and dysphoric mood.     Objective:  BP 122/61   Pulse 74   Ht 6\' 3"  (1.905 m)   Wt 190 lb 6.4 oz (86.4 kg)   SpO2 96%   BMI 23.80 kg/m      05/22/2023    2:42 PM 11/29/2022   11:16 AM 11/15/2022    1:46 PM  BP/Weight  Systolic BP 122 109 126  Diastolic BP 61 67 81  Wt. (Lbs) 190.4 173.8  171  BMI 23.8 kg/m2 21.72 kg/m2 21.37 kg/m2      Physical Exam Constitutional:      Appearance: He is well-developed.  Cardiovascular:     Rate and Rhythm: Normal rate.     Heart sounds: Normal heart sounds. No murmur heard. Pulmonary:     Effort: Pulmonary effort is normal.     Breath sounds: Normal breath sounds. No wheezing or rales.  Chest:     Chest wall: No tenderness.  Abdominal:     General: Bowel sounds are normal. There is no distension.     Palpations: Abdomen is soft. There is no mass.     Tenderness: There is no abdominal tenderness.  Musculoskeletal:        General: Normal range of motion.     Right lower leg: No edema.     Left lower leg: No edema.  Neurological:     Mental Status: He is alert and oriented to person, place, and time.  Psychiatric:        Mood and Affect: Mood normal.        Latest Ref Rng & Units 11/29/2022   11:03 AM 09/13/2022    2:47 PM 05/17/2022   10:28 AM  CMP  Glucose 70 - 99 mg/dL 161  096  84   BUN 6 - 20 mg/dL 21  16  10    Creatinine 0.61 - 1.24 mg/dL 0.45  4.09  8.11   Sodium 135 - 145 mmol/L 140  143  143   Potassium 3.5 - 5.1 mmol/L 3.9  4.2  4.3   Chloride 98 - 111 mmol/L 109  103  105   CO2 22 - 32 mmol/L 27  23  24    Calcium 8.9 - 10.3 mg/dL 8.8  9.9  9.5   Total  Protein 6.5 - 8.1 g/dL 6.9   6.4   Total Bilirubin 0.3 - 1.2 mg/dL 0.3   0.3   Alkaline Phos 38 - 126 U/L 101   148   AST 15 - 41 U/L 14   13   ALT 0 - 44 U/L 13   15     Lipid Panel  No results found for: "CHOL", "TRIG", "HDL", "CHOLHDL", "VLDL", "LDLCALC", "LDLDIRECT"  CBC    Component Value Date/Time   WBC 11.7 (H) 11/29/2022 1103   WBC 9.7 09/22/2021 0409   RBC 4.63 11/29/2022 1103   HGB 14.4 11/29/2022 1103   HGB 16.0 09/13/2022 1447   HCT 41.6 11/29/2022 1103   HCT 49.3 09/13/2022 1447   PLT 353 11/29/2022 1103   PLT 360 09/13/2022 1447   MCV 89.8 11/29/2022 1103   MCV 93 09/13/2022 1447   MCH 31.1 11/29/2022 1103   MCHC 34.6 11/29/2022 1103   RDW 14.0 11/29/2022 1103   RDW 13.0 09/13/2022 1447   LYMPHSABS 2.3 11/29/2022 1103   LYMPHSABS 1.8 09/13/2022 1447   MONOABS 0.8 11/29/2022 1103   EOSABS 0.1 11/29/2022 1103   EOSABS 0.0 09/13/2022 1447   BASOSABS 0.1 11/29/2022 1103   BASOSABS 0.1 09/13/2022 1447    Lab Results  Component Value Date   HGBA1C 5.6 12/29/2021    Assessment & Plan:      Hypertension Well controlled on Amlodipine 10mg  daily. -Continue Amlodipine 10mg  daily. -Counseled on blood pressure goal of less than 130/80, low-sodium, DASH diet, medication compliance, 150 minutes of moderate intensity exercise per week. Discussed medication compliance, adverse effects.   Gastroesophageal Reflux Disease (GERD) On Protonix. -Continue Protonix as  prescribed.  Iron and Vitamin B12 Deficiency On supplementation. Last labs in August showed no anemia and normal B12 levels. -Check iron and B12 levels again to ensure continued efficacy of supplementation.  Musculoskeletal Pain Occasional use of muscle relaxant for left arm pain. -Continue muscle relaxant as needed.   Follow-up in 6 months.          Meds ordered this encounter  Medications   amLODipine (NORVASC) 10 MG tablet    Sig: Take 1 tablet (10 mg total) by mouth daily.     Dispense:  90 tablet    Refill:  1   pantoprazole (PROTONIX) 40 MG tablet    Sig: Take 1 tablet (40 mg total) by mouth daily.    Dispense:  90 tablet    Refill:  1   methocarbamol (ROBAXIN) 500 MG tablet    Sig: Take 1 tablet (500 mg total) by mouth every 8 (eight) hours as needed for muscle spasms.    Dispense:  90 tablet    Refill:  2    Follow-up: No follow-ups on file.       Hoy Register, MD, FAAFP. [facility] [place], [place] [phone]   05/22/2023, 5:31 PM

## 2023-05-23 ENCOUNTER — Ambulatory Visit: Payer: Medicaid Other | Attending: Critical Care Medicine

## 2023-05-23 DIAGNOSIS — D649 Anemia, unspecified: Secondary | ICD-10-CM

## 2023-05-23 DIAGNOSIS — I10 Essential (primary) hypertension: Secondary | ICD-10-CM

## 2023-05-23 DIAGNOSIS — E538 Deficiency of other specified B group vitamins: Secondary | ICD-10-CM

## 2023-05-24 ENCOUNTER — Encounter: Payer: Self-pay | Admitting: Family Medicine

## 2023-05-24 LAB — CBC WITH DIFFERENTIAL/PLATELET
Basophils Absolute: 0.1 10*3/uL (ref 0.0–0.2)
Basos: 1 %
EOS (ABSOLUTE): 0.2 10*3/uL (ref 0.0–0.4)
Eos: 2 %
Hematocrit: 49 % (ref 37.5–51.0)
Hemoglobin: 15.9 g/dL (ref 13.0–17.7)
Immature Grans (Abs): 0 10*3/uL (ref 0.0–0.1)
Immature Granulocytes: 0 %
Lymphocytes Absolute: 1.5 10*3/uL (ref 0.7–3.1)
Lymphs: 18 %
MCH: 29.9 pg (ref 26.6–33.0)
MCHC: 32.4 g/dL (ref 31.5–35.7)
MCV: 92 fL (ref 79–97)
Monocytes Absolute: 0.9 10*3/uL (ref 0.1–0.9)
Monocytes: 11 %
Neutrophils Absolute: 5.6 10*3/uL (ref 1.4–7.0)
Neutrophils: 68 %
Platelets: 328 10*3/uL (ref 150–450)
RBC: 5.31 x10E6/uL (ref 4.14–5.80)
RDW: 13.1 % (ref 11.6–15.4)
WBC: 8.4 10*3/uL (ref 3.4–10.8)

## 2023-05-24 LAB — BASIC METABOLIC PANEL
BUN/Creatinine Ratio: 11 (ref 9–20)
BUN: 11 mg/dL (ref 6–24)
CO2: 21 mmol/L (ref 20–29)
Calcium: 9.3 mg/dL (ref 8.7–10.2)
Chloride: 107 mmol/L — ABNORMAL HIGH (ref 96–106)
Creatinine, Ser: 0.96 mg/dL (ref 0.76–1.27)
Glucose: 114 mg/dL — ABNORMAL HIGH (ref 70–99)
Potassium: 4.6 mmol/L (ref 3.5–5.2)
Sodium: 142 mmol/L (ref 134–144)
eGFR: 91 mL/min/{1.73_m2} (ref >59)

## 2023-05-24 LAB — LP+NON-HDL CHOLESTEROL
Cholesterol, Total: 183 mg/dL (ref 100–199)
HDL: 66 mg/dL (ref >39)
LDL Chol Calc (NIH): 99 mg/dL (ref 0–99)
Total Non-HDL-Chol (LDL+VLDL): 117 mg/dL (ref 0–129)
Triglycerides: 103 mg/dL (ref 0–149)
VLDL Cholesterol Cal: 18 mg/dL (ref 5–40)

## 2023-05-24 LAB — VITAMIN B12: Vitamin B-12: 705 pg/mL (ref 232–1245)

## 2023-09-11 ENCOUNTER — Other Ambulatory Visit: Payer: Self-pay | Admitting: Nurse Practitioner

## 2023-09-11 DIAGNOSIS — E538 Deficiency of other specified B group vitamins: Secondary | ICD-10-CM

## 2023-10-18 ENCOUNTER — Other Ambulatory Visit: Payer: Self-pay | Admitting: Family Medicine

## 2023-10-18 DIAGNOSIS — M62838 Other muscle spasm: Secondary | ICD-10-CM

## 2023-11-25 ENCOUNTER — Other Ambulatory Visit: Payer: Self-pay | Admitting: Family Medicine

## 2023-11-25 DIAGNOSIS — K219 Gastro-esophageal reflux disease without esophagitis: Secondary | ICD-10-CM

## 2023-11-29 ENCOUNTER — Other Ambulatory Visit: Payer: Self-pay

## 2023-11-29 ENCOUNTER — Other Ambulatory Visit: Payer: Self-pay | Admitting: Nurse Practitioner

## 2023-11-29 DIAGNOSIS — D649 Anemia, unspecified: Secondary | ICD-10-CM

## 2023-11-29 DIAGNOSIS — D75839 Thrombocytosis, unspecified: Secondary | ICD-10-CM

## 2023-11-29 DIAGNOSIS — E538 Deficiency of other specified B group vitamins: Secondary | ICD-10-CM

## 2023-11-29 NOTE — Progress Notes (Deleted)
 Patient Care Team: Evan Belvie BRAVO, MD as PCP - General (Pulmonary Disease)  Clinic Day:  11/29/2023  Referring physician: Brien Belvie BRAVO, MD  ASSESSMENT & PLAN:   Assessment & Plan: Vitamin B12 deficiency secondary to gastrectomy and NSAID induced gastritis -presented to ED with symptomatic anemia while taking 2 NSAIDs per day x 1 year for pain prophylaxis (he was working laborious jobs at the time) -Workup 09/22/2021 showed microcytic anemia with Hgb 3.8, thrombocytosis, ferritin 2, iron  11, TIBC 624, and transferrin saturation 2%, folate low 146.  Immature retic % was elevated but absolute retic count normal. FOBT negative.  PT-INR and aPTT were normal.  No evidence of CKD -He was admitted 6/3 - 09/22/21, endoscopy by Dr. Rosalie showed chronic gastritis; no bleeding source on colonoscopy.  He received 5 units blood transfusion, 1 B12 injection, and reportedly received an IV iron  infusion but I do not see evidence of that on the inpatient flowsheet. -Repeat labs PCP 7/5 showed Hgb 11.5, normal MCV and platelet, normal B12 and folate, and improving iron  panel -Last IV iron  12/2021. He stopped oral iron  and B12 at that time on his own -Due to his previous gastrectomy he understands he may not absorb nutrients taken orally and may require long-term B12 injection and IV iron  -[name] appears well, asymptomatic. Today's labs show mild leukocytosis/neutrophilia (since 05/17/22, improving), no anemia or thrombocytosis -B12 has dropped since he stopped oral B12, today's level is 192. I recommend to restart oral B12 -Iron  pane with low 14% sat and elevated TIBC 482, and ferritin 19 -I recommend to restart oral B12 and iron , take iron  with vit C and 2 hours apart from PPI to promote absorption. He will likely need to take these indefinitely. I refilled for him -lab, f/up with me in 1 year, or sooner if needed  History of gastric cancer  pT1b N0 M0, grade I, stage IA -He developed abdominal pain  and n/v in 10/2018, CT showed gastric wall thickening. I don't have records of diagnostic endoscopy or initial path -S/p subtotal gastrectomy with B2 reconstruction and J-tube placement by Dr. 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  He knows to contact our office if he develops concerns prior to his next appointment.  I provided *** minutes of face-to-face time during this encounter and > 50% was spent counseling as documented under my assessment and plan.    Evan Brown Lessen, NP  [facility] - A DEPT OF Evan Brown. [facility] [address] [place] [address] Dept: [phone] Dept Fax: [phone]   No orders of the defined types were placed in this encounter.     CHIEF COMPLAINT:  CC: Vitamin B12 deficiency  Current Treatment: Oral B12  INTERVAL HISTORY:  Evan Brown is here today for repeat clinical assessment.  He last saw Lacie, NP on 11/29/2022. Restarted oral vitamin B12. He denies fevers or chills. He denies pain. His appetite is good. His weight {Weight change:10426}.  I have reviewed the past medical history, past surgical history, social history and family history with the patient and they are unchanged from previous note.  ALLERGIES:  has no known allergies.  MEDICATIONS:  Current Outpatient Medications  Medication Sig Dispense Refill   acetaminophen  (TYLENOL ) 500 MG tablet Take 500 mg by mouth every 6 (six) hours as needed for moderate pain.     amLODipine  (NORVASC ) 10 MG tablet Take 1 tablet (10 mg total) by mouth daily. 90 tablet 1   cyanocobalamin  (VITAMIN B12) 1000 MCG tablet TAKE 1 TABLET BY MOUTH  EVERY DAY 100 tablet 3   ferrous sulfate  325 (65 FE) MG tablet Take 1 tablet (325 mg total) by mouth daily with breakfast. 90 tablet 3   methocarbamol  (ROBAXIN ) 500 MG tablet TAKE 1 TABLET BY MOUTH EVERY 8 HOURS AS NEEDED FOR MUSCLE SPASMS. 90 tablet 1   nicotine  polacrilex (NICORETTE  MINI) 4 MG lozenge Use three times daily to quit smoking (Patient not taking: Reported on 05/22/2023) 100 tablet 4   pantoprazole  (PROTONIX ) 40 MG tablet TAKE 1 TABLET BY MOUTH EVERY DAY 90 tablet 0   No current facility-administered medications for this visit.    HISTORY OF PRESENT ILLNESS:   Oncology History  Gastric cancer pT1pN0 (0/12LN) s/p subtotal gastrectomy/B2 reconstruction 03/19/2019  03/19/2019 Initial Diagnosis   Gastric cancer pT1pN0 (0/12LN) s/p subtotal gastrectomy/B2 reconstruction 03/19/2019   03/19/2019 Cancer Staging   Staging form: Stomach, AJCC 8th Edition - Pathologic stage from 03/19/2019: Stage IA (pT1b, pN0, cM0) - Signed by Evan Brown, Evan K, NP on 11/25/2021 Total positive nodes: 0 Histologic grade (G): G1 Histologic grading system: 3 grade system       REVIEW OF SYSTEMS:   Constitutional: Denies fevers, chills or abnormal weight loss Eyes: Denies blurriness of vision Ears, nose, mouth, throat, and face: Denies mucositis or sore throat Respiratory: Denies cough, dyspnea or wheezes Cardiovascular: Denies palpitation, chest discomfort or lower extremity swelling Gastrointestinal:  Denies nausea, heartburn or change in bowel habits Skin: Denies abnormal skin rashes Lymphatics: Denies new lymphadenopathy or easy bruising Neurological:Denies numbness, tingling or new weaknesses Behavioral/Psych: Mood is stable, no new changes  All other systems were reviewed with the patient and are negative.   VITALS:  There were no vitals taken for this visit.  Wt Readings from Last 3 Encounters:  05/22/23 190 lb 6.4 oz (86.4 kg)  11/29/22 173 lb 12.8 oz (78.8 kg)  11/15/22 171 lb (77.6 kg)     There is no height or weight on file to calculate BMI.  Performance status (ECOG): {CHL ONC [id number]  PHYSICAL EXAM:   GENERAL:alert, no distress and comfortable SKIN: skin color, texture, turgor are normal, no rashes or significant lesions EYES: normal, Conjunctiva are pink and non-injected, sclera clear OROPHARYNX:no exudate, no erythema and lips, buccal mucosa, and tongue normal  NECK: supple, thyroid  normal size, non-tender, without nodularity LYMPH:  no palpable lymphadenopathy in the cervical, axillary or inguinal LUNGS: clear to auscultation and percussion with normal breathing effort HEART: regular rate & rhythm and no murmurs and no lower extremity edema ABDOMEN:abdomen soft, non-tender and normal bowel sounds Musculoskeletal:no cyanosis of digits and no clubbing  NEURO: alert & oriented x 3 with fluent speech, no focal motor/sensory deficits  LABORATORY DATA:  I have reviewed the data as listed    Component Value Date/Time   NA 142 05/23/2023 1033   Brown 4.6 05/23/2023 1033   CL 107 (H) 05/23/2023 1033   CO2 21 05/23/2023 1033   GLUCOSE 114 (H) 05/23/2023 1033   GLUCOSE 102 (H) 11/29/2022 1103   BUN 11 05/23/2023 1033   CREATININE 0.96 05/23/2023 1033   CREATININE 0.87 11/29/2022 1103   CALCIUM  9.3 05/23/2023 1033   PROT 6.9 11/29/2022 1103   PROT 6.4 05/17/2022 1028   ALBUMIN 4.0 11/29/2022 1103   ALBUMIN 4.2 05/17/2022 1028   AST 14 (L) 11/29/2022 1103   ALT 13 11/29/2022 1103   ALKPHOS 101 11/29/2022 1103   BILITOT 0.3 11/29/2022 1103   GFRNONAA >60 11/29/2022 1103   GFRAA >60 03/24/2019 0332    No results found for: SPEP, UPEP  Lab Results  Component Value Date   WBC 8.4 05/23/2023   NEUTROABS 5.6 05/23/2023   HGB 15.9 05/23/2023   HCT 49.0 05/23/2023   MCV 92 05/23/2023   PLT 328 05/23/2023      Chemistry      Component Value Date/Time   NA 142 05/23/2023 1033   Brown 4.6 05/23/2023 1033   CL 107 (H) 05/23/2023 1033   CO2 21  05/23/2023 1033   BUN 11 05/23/2023 1033   CREATININE 0.96 05/23/2023 1033   CREATININE 0.87 11/29/2022 1103      Component Value Date/Time   CALCIUM 9.3 05/23/2023 1033   ALKPHOS 101 11/29/2022 1103   AST 14 (L) 11/29/2022 1103   ALT 13 11/29/2022 1103   BILITOT 0.3 11/29/2022 1103       RADIOGRAPHIC STUDIES: I have personally reviewed the radiological images as listed and agreed with the findings in the report. No results found.

## 2023-11-29 NOTE — Assessment & Plan Note (Deleted)
 pT1b N0 M0, grade I, stage IA -He developed abdominal pain and n/v in 10/2018, CT showed gastric wall thickening. I don't have records of diagnostic endoscopy or initial path -S/p subtotal gastrectomy with B2 reconstruction and J-tube placement by Dr. 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

## 2023-11-29 NOTE — Assessment & Plan Note (Deleted)
 secondary to gastrectomy and NSAID induced gastritis -presented to ED with symptomatic anemia while taking 2 NSAIDs per day x 1 year for pain prophylaxis (he was working laborious jobs at the time) -Workup 09/22/2021 showed microcytic anemia with Hgb 3.8, thrombocytosis, ferritin 2, iron  11, TIBC 624, and transferrin saturation 2%, folate low 146.  Immature retic % was elevated but absolute retic count normal. FOBT negative.  PT-INR and aPTT were normal.  No evidence of CKD -He was admitted 6/3 - 09/22/21, endoscopy by Dr. Rosalie showed chronic gastritis; no bleeding source on colonoscopy.  He received 5 units blood transfusion, 1 B12 injection, and reportedly received an IV iron  infusion but I do not see evidence of that on the inpatient flowsheet. -Repeat labs PCP 7/5 showed Hgb 11.5, normal MCV and platelet, normal B12 and folate, and improving iron  panel -Last IV iron  12/2021. He stopped oral iron  and B12 at that time on his own -Due to his previous gastrectomy he understands he may not absorb nutrients taken orally and may require long-term B12 injection and IV iron  -Mr. Rivero appears well, asymptomatic. Today's labs show mild leukocytosis/neutrophilia (since 05/17/22, improving), no anemia or thrombocytosis -B12 has dropped since he stopped oral B12, today's level is 192. I recommend to restart oral B12 -Iron  pane with low 14% sat and elevated TIBC 482, and ferritin 19 -I recommend to restart oral B12 and iron , take iron  with vit C and 2 hours apart from PPI to promote absorption. He will likely need to take these indefinitely. I refilled for him -lab, f/up with me in 1 year, or sooner if needed

## 2023-11-30 ENCOUNTER — Inpatient Hospital Stay: Payer: Medicaid Other | Attending: Critical Care Medicine

## 2023-11-30 ENCOUNTER — Inpatient Hospital Stay: Payer: Medicaid Other | Admitting: Nurse Practitioner

## 2023-11-30 DIAGNOSIS — Z85028 Personal history of other malignant neoplasm of stomach: Secondary | ICD-10-CM

## 2023-11-30 DIAGNOSIS — E538 Deficiency of other specified B group vitamins: Secondary | ICD-10-CM

## 2024-01-12 ENCOUNTER — Encounter: Payer: Self-pay | Admitting: Nurse Practitioner

## 2024-01-24 ENCOUNTER — Other Ambulatory Visit: Payer: Self-pay | Admitting: Nurse Practitioner

## 2024-01-24 DIAGNOSIS — D75839 Thrombocytosis, unspecified: Secondary | ICD-10-CM

## 2024-01-24 DIAGNOSIS — D649 Anemia, unspecified: Secondary | ICD-10-CM

## 2024-02-27 ENCOUNTER — Other Ambulatory Visit: Payer: Self-pay | Admitting: Family Medicine

## 2024-02-27 DIAGNOSIS — K219 Gastro-esophageal reflux disease without esophagitis: Secondary | ICD-10-CM

## 2024-03-02 ENCOUNTER — Other Ambulatory Visit: Payer: Self-pay | Admitting: Family Medicine

## 2024-03-02 DIAGNOSIS — I10 Essential (primary) hypertension: Secondary | ICD-10-CM

## 2024-04-10 ENCOUNTER — Other Ambulatory Visit: Payer: Self-pay | Admitting: Family Medicine

## 2024-04-10 DIAGNOSIS — I10 Essential (primary) hypertension: Secondary | ICD-10-CM

## 2024-04-16 ENCOUNTER — Other Ambulatory Visit: Payer: Self-pay | Admitting: Family Medicine

## 2024-04-16 DIAGNOSIS — I10 Essential (primary) hypertension: Secondary | ICD-10-CM
